# Patient Record
Sex: Male | Born: 1935 | Race: White | Hispanic: No | Smoking: Former smoker
Health system: Southern US, Community
[De-identification: ages and names within clinical notes are randomized; demographics above are authoritative.]

## PROBLEM LIST (undated history)

## (undated) DIAGNOSIS — I10 Essential (primary) hypertension: Secondary | ICD-10-CM

## (undated) DIAGNOSIS — M069 Rheumatoid arthritis, unspecified: Secondary | ICD-10-CM

## (undated) DIAGNOSIS — M1990 Unspecified osteoarthritis, unspecified site: Secondary | ICD-10-CM

## (undated) DIAGNOSIS — K219 Gastro-esophageal reflux disease without esophagitis: Secondary | ICD-10-CM

## (undated) HISTORY — PX: TONSILLECTOMY: SUR1361

## (undated) HISTORY — PX: INNER EAR SURGERY: SHX679

## (undated) HISTORY — PX: RETINAL DETACHMENT SURGERY: SHX105

## (undated) HISTORY — PX: REPLACEMENT TOTAL KNEE: SUR1224

## (undated) HISTORY — PX: EYE SURGERY: SHX253

## (undated) HISTORY — PX: JOINT REPLACEMENT: SHX530

## (undated) HISTORY — PX: ANKLE SURGERY: SHX546

## (undated) HISTORY — PX: WRIST SURGERY: SHX841

---

## 2018-05-11 DIAGNOSIS — M05772 Rheumatoid arthritis with rheumatoid factor of left ankle and foot without organ or systems involvement: Secondary | ICD-10-CM | POA: Diagnosis not present

## 2018-05-11 DIAGNOSIS — M25571 Pain in right ankle and joints of right foot: Secondary | ICD-10-CM | POA: Diagnosis not present

## 2018-05-11 DIAGNOSIS — M05771 Rheumatoid arthritis with rheumatoid factor of right ankle and foot without organ or systems involvement: Secondary | ICD-10-CM | POA: Diagnosis not present

## 2018-05-11 DIAGNOSIS — M2011 Hallux valgus (acquired), right foot: Secondary | ICD-10-CM | POA: Diagnosis not present

## 2018-05-11 DIAGNOSIS — M2022 Hallux rigidus, left foot: Secondary | ICD-10-CM | POA: Diagnosis not present

## 2018-05-11 DIAGNOSIS — M2012 Hallux valgus (acquired), left foot: Secondary | ICD-10-CM | POA: Diagnosis not present

## 2018-05-11 DIAGNOSIS — M2021 Hallux rigidus, right foot: Secondary | ICD-10-CM | POA: Diagnosis not present

## 2018-05-11 DIAGNOSIS — M25572 Pain in left ankle and joints of left foot: Secondary | ICD-10-CM | POA: Diagnosis not present

## 2018-05-11 DIAGNOSIS — M19072 Primary osteoarthritis, left ankle and foot: Secondary | ICD-10-CM | POA: Diagnosis not present

## 2018-05-11 DIAGNOSIS — M19071 Primary osteoarthritis, right ankle and foot: Secondary | ICD-10-CM | POA: Diagnosis not present

## 2018-05-20 DIAGNOSIS — M19071 Primary osteoarthritis, right ankle and foot: Secondary | ICD-10-CM | POA: Diagnosis not present

## 2018-05-20 DIAGNOSIS — M25572 Pain in left ankle and joints of left foot: Secondary | ICD-10-CM | POA: Diagnosis not present

## 2018-05-20 DIAGNOSIS — M05771 Rheumatoid arthritis with rheumatoid factor of right ankle and foot without organ or systems involvement: Secondary | ICD-10-CM | POA: Diagnosis not present

## 2018-05-20 DIAGNOSIS — M79672 Pain in left foot: Secondary | ICD-10-CM | POA: Diagnosis not present

## 2018-05-20 DIAGNOSIS — M25571 Pain in right ankle and joints of right foot: Secondary | ICD-10-CM | POA: Diagnosis not present

## 2018-05-20 DIAGNOSIS — M2011 Hallux valgus (acquired), right foot: Secondary | ICD-10-CM | POA: Diagnosis not present

## 2018-05-20 DIAGNOSIS — M2012 Hallux valgus (acquired), left foot: Secondary | ICD-10-CM | POA: Diagnosis not present

## 2018-05-20 DIAGNOSIS — M19072 Primary osteoarthritis, left ankle and foot: Secondary | ICD-10-CM | POA: Diagnosis not present

## 2018-05-20 DIAGNOSIS — M2022 Hallux rigidus, left foot: Secondary | ICD-10-CM | POA: Diagnosis not present

## 2018-05-20 DIAGNOSIS — M2021 Hallux rigidus, right foot: Secondary | ICD-10-CM | POA: Diagnosis not present

## 2018-05-20 DIAGNOSIS — M05772 Rheumatoid arthritis with rheumatoid factor of left ankle and foot without organ or systems involvement: Secondary | ICD-10-CM | POA: Diagnosis not present

## 2018-06-01 DIAGNOSIS — M2021 Hallux rigidus, right foot: Secondary | ICD-10-CM | POA: Diagnosis not present

## 2018-06-01 DIAGNOSIS — M05772 Rheumatoid arthritis with rheumatoid factor of left ankle and foot without organ or systems involvement: Secondary | ICD-10-CM | POA: Diagnosis not present

## 2018-06-01 DIAGNOSIS — M2022 Hallux rigidus, left foot: Secondary | ICD-10-CM | POA: Diagnosis not present

## 2018-06-01 DIAGNOSIS — M19071 Primary osteoarthritis, right ankle and foot: Secondary | ICD-10-CM | POA: Diagnosis not present

## 2018-06-01 DIAGNOSIS — M19072 Primary osteoarthritis, left ankle and foot: Secondary | ICD-10-CM | POA: Diagnosis not present

## 2018-06-01 DIAGNOSIS — M05771 Rheumatoid arthritis with rheumatoid factor of right ankle and foot without organ or systems involvement: Secondary | ICD-10-CM | POA: Diagnosis not present

## 2018-06-01 DIAGNOSIS — M2011 Hallux valgus (acquired), right foot: Secondary | ICD-10-CM | POA: Diagnosis not present

## 2018-06-01 DIAGNOSIS — M2012 Hallux valgus (acquired), left foot: Secondary | ICD-10-CM | POA: Diagnosis not present

## 2018-06-11 DIAGNOSIS — Z23 Encounter for immunization: Secondary | ICD-10-CM | POA: Diagnosis not present

## 2018-06-11 DIAGNOSIS — M1990 Unspecified osteoarthritis, unspecified site: Secondary | ICD-10-CM | POA: Diagnosis not present

## 2018-06-11 DIAGNOSIS — H7190 Unspecified cholesteatoma, unspecified ear: Secondary | ICD-10-CM | POA: Diagnosis not present

## 2018-06-11 DIAGNOSIS — D692 Other nonthrombocytopenic purpura: Secondary | ICD-10-CM | POA: Diagnosis not present

## 2018-06-11 DIAGNOSIS — Z6827 Body mass index (BMI) 27.0-27.9, adult: Secondary | ICD-10-CM | POA: Diagnosis not present

## 2018-06-11 DIAGNOSIS — R351 Nocturia: Secondary | ICD-10-CM | POA: Diagnosis not present

## 2018-06-11 DIAGNOSIS — I10 Essential (primary) hypertension: Secondary | ICD-10-CM | POA: Diagnosis not present

## 2018-06-11 DIAGNOSIS — Z8669 Personal history of other diseases of the nervous system and sense organs: Secondary | ICD-10-CM | POA: Diagnosis not present

## 2018-06-11 DIAGNOSIS — M069 Rheumatoid arthritis, unspecified: Secondary | ICD-10-CM | POA: Diagnosis not present

## 2018-06-11 DIAGNOSIS — H9193 Unspecified hearing loss, bilateral: Secondary | ICD-10-CM | POA: Diagnosis not present

## 2018-06-11 DIAGNOSIS — D8989 Other specified disorders involving the immune mechanism, not elsewhere classified: Secondary | ICD-10-CM | POA: Diagnosis not present

## 2018-06-11 DIAGNOSIS — R7309 Other abnormal glucose: Secondary | ICD-10-CM | POA: Diagnosis not present

## 2018-06-11 DIAGNOSIS — R82998 Other abnormal findings in urine: Secondary | ICD-10-CM | POA: Diagnosis not present

## 2018-07-08 DIAGNOSIS — M25511 Pain in right shoulder: Secondary | ICD-10-CM | POA: Diagnosis not present

## 2018-07-08 DIAGNOSIS — Z6826 Body mass index (BMI) 26.0-26.9, adult: Secondary | ICD-10-CM | POA: Diagnosis not present

## 2018-07-08 DIAGNOSIS — M2550 Pain in unspecified joint: Secondary | ICD-10-CM | POA: Diagnosis not present

## 2018-07-08 DIAGNOSIS — G8929 Other chronic pain: Secondary | ICD-10-CM | POA: Diagnosis not present

## 2018-07-08 DIAGNOSIS — M0579 Rheumatoid arthritis with rheumatoid factor of multiple sites without organ or systems involvement: Secondary | ICD-10-CM | POA: Diagnosis not present

## 2018-07-08 DIAGNOSIS — E663 Overweight: Secondary | ICD-10-CM | POA: Diagnosis not present

## 2018-07-10 DIAGNOSIS — I10 Essential (primary) hypertension: Secondary | ICD-10-CM | POA: Diagnosis not present

## 2018-07-10 DIAGNOSIS — M19072 Primary osteoarthritis, left ankle and foot: Secondary | ICD-10-CM | POA: Diagnosis not present

## 2018-07-10 DIAGNOSIS — Z01818 Encounter for other preprocedural examination: Secondary | ICD-10-CM | POA: Diagnosis not present

## 2018-07-10 DIAGNOSIS — M5030 Other cervical disc degeneration, unspecified cervical region: Secondary | ICD-10-CM | POA: Diagnosis not present

## 2018-07-10 DIAGNOSIS — M47816 Spondylosis without myelopathy or radiculopathy, lumbar region: Secondary | ICD-10-CM | POA: Diagnosis not present

## 2018-07-10 DIAGNOSIS — M0520 Rheumatoid vasculitis with rheumatoid arthritis of unspecified site: Secondary | ICD-10-CM | POA: Diagnosis not present

## 2018-07-14 DIAGNOSIS — Z961 Presence of intraocular lens: Secondary | ICD-10-CM | POA: Diagnosis not present

## 2018-07-14 DIAGNOSIS — H33051 Total retinal detachment, right eye: Secondary | ICD-10-CM | POA: Diagnosis not present

## 2018-07-14 DIAGNOSIS — H353132 Nonexudative age-related macular degeneration, bilateral, intermediate dry stage: Secondary | ICD-10-CM | POA: Diagnosis not present

## 2018-07-14 DIAGNOSIS — H26492 Other secondary cataract, left eye: Secondary | ICD-10-CM | POA: Diagnosis not present

## 2018-08-04 DIAGNOSIS — M19072 Primary osteoarthritis, left ankle and foot: Secondary | ICD-10-CM | POA: Diagnosis not present

## 2018-08-04 DIAGNOSIS — M7989 Other specified soft tissue disorders: Secondary | ICD-10-CM | POA: Diagnosis not present

## 2018-08-04 DIAGNOSIS — I10 Essential (primary) hypertension: Secondary | ICD-10-CM | POA: Diagnosis not present

## 2018-08-04 DIAGNOSIS — M069 Rheumatoid arthritis, unspecified: Secondary | ICD-10-CM | POA: Diagnosis not present

## 2018-08-05 DIAGNOSIS — M069 Rheumatoid arthritis, unspecified: Secondary | ICD-10-CM | POA: Diagnosis not present

## 2018-08-05 DIAGNOSIS — I10 Essential (primary) hypertension: Secondary | ICD-10-CM | POA: Diagnosis not present

## 2018-08-06 DIAGNOSIS — M2012 Hallux valgus (acquired), left foot: Secondary | ICD-10-CM | POA: Diagnosis present

## 2018-08-06 DIAGNOSIS — M19072 Primary osteoarthritis, left ankle and foot: Secondary | ICD-10-CM | POA: Diagnosis not present

## 2018-08-06 DIAGNOSIS — R278 Other lack of coordination: Secondary | ICD-10-CM | POA: Diagnosis not present

## 2018-08-06 DIAGNOSIS — Z87891 Personal history of nicotine dependence: Secondary | ICD-10-CM | POA: Diagnosis not present

## 2018-08-06 DIAGNOSIS — M2021 Hallux rigidus, right foot: Secondary | ICD-10-CM | POA: Diagnosis present

## 2018-08-06 DIAGNOSIS — M2022 Hallux rigidus, left foot: Secondary | ICD-10-CM | POA: Diagnosis present

## 2018-08-06 DIAGNOSIS — M069 Rheumatoid arthritis, unspecified: Secondary | ICD-10-CM | POA: Diagnosis not present

## 2018-08-06 DIAGNOSIS — Z4789 Encounter for other orthopedic aftercare: Secondary | ICD-10-CM | POA: Diagnosis not present

## 2018-08-06 DIAGNOSIS — Z79899 Other long term (current) drug therapy: Secondary | ICD-10-CM | POA: Diagnosis not present

## 2018-08-06 DIAGNOSIS — I10 Essential (primary) hypertension: Secondary | ICD-10-CM | POA: Diagnosis not present

## 2018-08-06 DIAGNOSIS — Z7982 Long term (current) use of aspirin: Secondary | ICD-10-CM | POA: Diagnosis not present

## 2018-08-06 DIAGNOSIS — R262 Difficulty in walking, not elsewhere classified: Secondary | ICD-10-CM | POA: Diagnosis not present

## 2018-08-06 DIAGNOSIS — M2011 Hallux valgus (acquired), right foot: Secondary | ICD-10-CM | POA: Diagnosis present

## 2018-08-06 DIAGNOSIS — Z741 Need for assistance with personal care: Secondary | ICD-10-CM | POA: Diagnosis not present

## 2018-08-10 DIAGNOSIS — M069 Rheumatoid arthritis, unspecified: Secondary | ICD-10-CM | POA: Diagnosis not present

## 2018-08-10 DIAGNOSIS — M19072 Primary osteoarthritis, left ankle and foot: Secondary | ICD-10-CM | POA: Diagnosis not present

## 2018-08-10 DIAGNOSIS — Z4789 Encounter for other orthopedic aftercare: Secondary | ICD-10-CM | POA: Diagnosis not present

## 2018-08-10 DIAGNOSIS — S99912A Unspecified injury of left ankle, initial encounter: Secondary | ICD-10-CM | POA: Diagnosis not present

## 2018-08-10 DIAGNOSIS — M159 Polyosteoarthritis, unspecified: Secondary | ICD-10-CM | POA: Diagnosis not present

## 2018-08-10 DIAGNOSIS — M0639 Rheumatoid nodule, multiple sites: Secondary | ICD-10-CM | POA: Diagnosis not present

## 2018-08-10 DIAGNOSIS — I10 Essential (primary) hypertension: Secondary | ICD-10-CM | POA: Diagnosis not present

## 2018-08-10 DIAGNOSIS — Z96662 Presence of left artificial ankle joint: Secondary | ICD-10-CM | POA: Diagnosis not present

## 2018-08-10 DIAGNOSIS — Z741 Need for assistance with personal care: Secondary | ICD-10-CM | POA: Diagnosis not present

## 2018-08-10 DIAGNOSIS — R262 Difficulty in walking, not elsewhere classified: Secondary | ICD-10-CM | POA: Diagnosis not present

## 2018-08-10 DIAGNOSIS — R278 Other lack of coordination: Secondary | ICD-10-CM | POA: Diagnosis not present

## 2018-08-10 DIAGNOSIS — M19071 Primary osteoarthritis, right ankle and foot: Secondary | ICD-10-CM | POA: Diagnosis not present

## 2018-08-11 DIAGNOSIS — S99912A Unspecified injury of left ankle, initial encounter: Secondary | ICD-10-CM | POA: Diagnosis not present

## 2018-08-11 DIAGNOSIS — M0639 Rheumatoid nodule, multiple sites: Secondary | ICD-10-CM | POA: Diagnosis not present

## 2018-08-11 DIAGNOSIS — M159 Polyosteoarthritis, unspecified: Secondary | ICD-10-CM | POA: Diagnosis not present

## 2018-08-11 DIAGNOSIS — I10 Essential (primary) hypertension: Secondary | ICD-10-CM | POA: Diagnosis not present

## 2018-08-17 DIAGNOSIS — M19071 Primary osteoarthritis, right ankle and foot: Secondary | ICD-10-CM | POA: Diagnosis not present

## 2018-08-17 DIAGNOSIS — M19072 Primary osteoarthritis, left ankle and foot: Secondary | ICD-10-CM | POA: Diagnosis not present

## 2018-08-24 DIAGNOSIS — M159 Polyosteoarthritis, unspecified: Secondary | ICD-10-CM | POA: Diagnosis not present

## 2018-08-24 DIAGNOSIS — I10 Essential (primary) hypertension: Secondary | ICD-10-CM | POA: Diagnosis not present

## 2018-08-24 DIAGNOSIS — Z96662 Presence of left artificial ankle joint: Secondary | ICD-10-CM | POA: Diagnosis not present

## 2018-08-24 DIAGNOSIS — M069 Rheumatoid arthritis, unspecified: Secondary | ICD-10-CM | POA: Diagnosis not present

## 2018-08-28 DIAGNOSIS — Z23 Encounter for immunization: Secondary | ICD-10-CM | POA: Diagnosis not present

## 2018-08-28 DIAGNOSIS — M1990 Unspecified osteoarthritis, unspecified site: Secondary | ICD-10-CM | POA: Diagnosis not present

## 2018-08-28 DIAGNOSIS — I10 Essential (primary) hypertension: Secondary | ICD-10-CM | POA: Diagnosis not present

## 2018-08-28 DIAGNOSIS — D8989 Other specified disorders involving the immune mechanism, not elsewhere classified: Secondary | ICD-10-CM | POA: Diagnosis not present

## 2018-08-28 DIAGNOSIS — M069 Rheumatoid arthritis, unspecified: Secondary | ICD-10-CM | POA: Diagnosis not present

## 2018-08-29 DIAGNOSIS — M19072 Primary osteoarthritis, left ankle and foot: Secondary | ICD-10-CM | POA: Diagnosis not present

## 2018-08-29 DIAGNOSIS — Z981 Arthrodesis status: Secondary | ICD-10-CM | POA: Diagnosis not present

## 2018-08-29 DIAGNOSIS — M069 Rheumatoid arthritis, unspecified: Secondary | ICD-10-CM | POA: Diagnosis not present

## 2018-08-29 DIAGNOSIS — Z4789 Encounter for other orthopedic aftercare: Secondary | ICD-10-CM | POA: Diagnosis not present

## 2018-08-29 DIAGNOSIS — I10 Essential (primary) hypertension: Secondary | ICD-10-CM | POA: Diagnosis not present

## 2018-08-31 DIAGNOSIS — Z981 Arthrodesis status: Secondary | ICD-10-CM | POA: Diagnosis not present

## 2018-08-31 DIAGNOSIS — M19072 Primary osteoarthritis, left ankle and foot: Secondary | ICD-10-CM | POA: Diagnosis not present

## 2018-08-31 DIAGNOSIS — Z4789 Encounter for other orthopedic aftercare: Secondary | ICD-10-CM | POA: Diagnosis not present

## 2018-08-31 DIAGNOSIS — M069 Rheumatoid arthritis, unspecified: Secondary | ICD-10-CM | POA: Diagnosis not present

## 2018-08-31 DIAGNOSIS — I10 Essential (primary) hypertension: Secondary | ICD-10-CM | POA: Diagnosis not present

## 2018-09-02 DIAGNOSIS — M19072 Primary osteoarthritis, left ankle and foot: Secondary | ICD-10-CM | POA: Diagnosis not present

## 2018-09-02 DIAGNOSIS — M069 Rheumatoid arthritis, unspecified: Secondary | ICD-10-CM | POA: Diagnosis not present

## 2018-09-02 DIAGNOSIS — Z981 Arthrodesis status: Secondary | ICD-10-CM | POA: Diagnosis not present

## 2018-09-02 DIAGNOSIS — I10 Essential (primary) hypertension: Secondary | ICD-10-CM | POA: Diagnosis not present

## 2018-09-02 DIAGNOSIS — Z4789 Encounter for other orthopedic aftercare: Secondary | ICD-10-CM | POA: Diagnosis not present

## 2018-09-04 DIAGNOSIS — Z4789 Encounter for other orthopedic aftercare: Secondary | ICD-10-CM | POA: Diagnosis not present

## 2018-09-04 DIAGNOSIS — I10 Essential (primary) hypertension: Secondary | ICD-10-CM | POA: Diagnosis not present

## 2018-09-04 DIAGNOSIS — Z981 Arthrodesis status: Secondary | ICD-10-CM | POA: Diagnosis not present

## 2018-09-04 DIAGNOSIS — M069 Rheumatoid arthritis, unspecified: Secondary | ICD-10-CM | POA: Diagnosis not present

## 2018-09-04 DIAGNOSIS — M19072 Primary osteoarthritis, left ankle and foot: Secondary | ICD-10-CM | POA: Diagnosis not present

## 2018-09-07 DIAGNOSIS — M069 Rheumatoid arthritis, unspecified: Secondary | ICD-10-CM | POA: Diagnosis not present

## 2018-09-07 DIAGNOSIS — Z981 Arthrodesis status: Secondary | ICD-10-CM | POA: Diagnosis not present

## 2018-09-07 DIAGNOSIS — Z4789 Encounter for other orthopedic aftercare: Secondary | ICD-10-CM | POA: Diagnosis not present

## 2018-09-07 DIAGNOSIS — I10 Essential (primary) hypertension: Secondary | ICD-10-CM | POA: Diagnosis not present

## 2018-09-07 DIAGNOSIS — M19072 Primary osteoarthritis, left ankle and foot: Secondary | ICD-10-CM | POA: Diagnosis not present

## 2018-09-09 DIAGNOSIS — M069 Rheumatoid arthritis, unspecified: Secondary | ICD-10-CM | POA: Diagnosis not present

## 2018-09-09 DIAGNOSIS — Z981 Arthrodesis status: Secondary | ICD-10-CM | POA: Diagnosis not present

## 2018-09-09 DIAGNOSIS — M19072 Primary osteoarthritis, left ankle and foot: Secondary | ICD-10-CM | POA: Diagnosis not present

## 2018-09-09 DIAGNOSIS — Z4789 Encounter for other orthopedic aftercare: Secondary | ICD-10-CM | POA: Diagnosis not present

## 2018-09-09 DIAGNOSIS — I10 Essential (primary) hypertension: Secondary | ICD-10-CM | POA: Diagnosis not present

## 2018-09-14 DIAGNOSIS — M19072 Primary osteoarthritis, left ankle and foot: Secondary | ICD-10-CM | POA: Diagnosis not present

## 2018-09-15 DIAGNOSIS — M19072 Primary osteoarthritis, left ankle and foot: Secondary | ICD-10-CM | POA: Diagnosis not present

## 2018-09-15 DIAGNOSIS — Z981 Arthrodesis status: Secondary | ICD-10-CM | POA: Diagnosis not present

## 2018-09-15 DIAGNOSIS — M069 Rheumatoid arthritis, unspecified: Secondary | ICD-10-CM | POA: Diagnosis not present

## 2018-09-15 DIAGNOSIS — I10 Essential (primary) hypertension: Secondary | ICD-10-CM | POA: Diagnosis not present

## 2018-09-15 DIAGNOSIS — Z4789 Encounter for other orthopedic aftercare: Secondary | ICD-10-CM | POA: Diagnosis not present

## 2018-09-17 DIAGNOSIS — M069 Rheumatoid arthritis, unspecified: Secondary | ICD-10-CM | POA: Diagnosis not present

## 2018-09-17 DIAGNOSIS — Z981 Arthrodesis status: Secondary | ICD-10-CM | POA: Diagnosis not present

## 2018-09-17 DIAGNOSIS — Z4789 Encounter for other orthopedic aftercare: Secondary | ICD-10-CM | POA: Diagnosis not present

## 2018-09-17 DIAGNOSIS — M19072 Primary osteoarthritis, left ankle and foot: Secondary | ICD-10-CM | POA: Diagnosis not present

## 2018-09-17 DIAGNOSIS — I10 Essential (primary) hypertension: Secondary | ICD-10-CM | POA: Diagnosis not present

## 2018-09-21 DIAGNOSIS — H906 Mixed conductive and sensorineural hearing loss, bilateral: Secondary | ICD-10-CM | POA: Diagnosis not present

## 2018-09-21 DIAGNOSIS — H95193 Other disorders following mastoidectomy, bilateral ears: Secondary | ICD-10-CM | POA: Diagnosis not present

## 2018-09-23 DIAGNOSIS — Z981 Arthrodesis status: Secondary | ICD-10-CM | POA: Diagnosis not present

## 2018-09-23 DIAGNOSIS — Z4789 Encounter for other orthopedic aftercare: Secondary | ICD-10-CM | POA: Diagnosis not present

## 2018-09-23 DIAGNOSIS — M19072 Primary osteoarthritis, left ankle and foot: Secondary | ICD-10-CM | POA: Diagnosis not present

## 2018-09-23 DIAGNOSIS — I10 Essential (primary) hypertension: Secondary | ICD-10-CM | POA: Diagnosis not present

## 2018-09-23 DIAGNOSIS — M069 Rheumatoid arthritis, unspecified: Secondary | ICD-10-CM | POA: Diagnosis not present

## 2018-09-25 DIAGNOSIS — I10 Essential (primary) hypertension: Secondary | ICD-10-CM | POA: Diagnosis not present

## 2018-09-25 DIAGNOSIS — Z4789 Encounter for other orthopedic aftercare: Secondary | ICD-10-CM | POA: Diagnosis not present

## 2018-09-25 DIAGNOSIS — M069 Rheumatoid arthritis, unspecified: Secondary | ICD-10-CM | POA: Diagnosis not present

## 2018-09-25 DIAGNOSIS — M19072 Primary osteoarthritis, left ankle and foot: Secondary | ICD-10-CM | POA: Diagnosis not present

## 2018-09-25 DIAGNOSIS — Z981 Arthrodesis status: Secondary | ICD-10-CM | POA: Diagnosis not present

## 2018-10-12 DIAGNOSIS — M19072 Primary osteoarthritis, left ankle and foot: Secondary | ICD-10-CM | POA: Diagnosis not present

## 2018-10-22 DIAGNOSIS — M2550 Pain in unspecified joint: Secondary | ICD-10-CM | POA: Diagnosis not present

## 2018-10-22 DIAGNOSIS — M25511 Pain in right shoulder: Secondary | ICD-10-CM | POA: Diagnosis not present

## 2018-10-22 DIAGNOSIS — E663 Overweight: Secondary | ICD-10-CM | POA: Diagnosis not present

## 2018-10-22 DIAGNOSIS — Z6827 Body mass index (BMI) 27.0-27.9, adult: Secondary | ICD-10-CM | POA: Diagnosis not present

## 2018-10-22 DIAGNOSIS — G8929 Other chronic pain: Secondary | ICD-10-CM | POA: Diagnosis not present

## 2018-10-22 DIAGNOSIS — M0579 Rheumatoid arthritis with rheumatoid factor of multiple sites without organ or systems involvement: Secondary | ICD-10-CM | POA: Diagnosis not present

## 2019-02-11 DIAGNOSIS — M0579 Rheumatoid arthritis with rheumatoid factor of multiple sites without organ or systems involvement: Secondary | ICD-10-CM | POA: Diagnosis not present

## 2019-02-16 ENCOUNTER — Ambulatory Visit: Payer: Self-pay | Admitting: Internal Medicine

## 2019-03-29 DIAGNOSIS — G8929 Other chronic pain: Secondary | ICD-10-CM | POA: Diagnosis not present

## 2019-03-29 DIAGNOSIS — M25511 Pain in right shoulder: Secondary | ICD-10-CM | POA: Diagnosis not present

## 2019-03-29 DIAGNOSIS — M2550 Pain in unspecified joint: Secondary | ICD-10-CM | POA: Diagnosis not present

## 2019-03-29 DIAGNOSIS — M25531 Pain in right wrist: Secondary | ICD-10-CM | POA: Diagnosis not present

## 2019-03-29 DIAGNOSIS — M0579 Rheumatoid arthritis with rheumatoid factor of multiple sites without organ or systems involvement: Secondary | ICD-10-CM | POA: Diagnosis not present

## 2019-03-31 DIAGNOSIS — M2021 Hallux rigidus, right foot: Secondary | ICD-10-CM | POA: Diagnosis not present

## 2019-03-31 DIAGNOSIS — M05771 Rheumatoid arthritis with rheumatoid factor of right ankle and foot without organ or systems involvement: Secondary | ICD-10-CM | POA: Diagnosis not present

## 2019-03-31 DIAGNOSIS — M05772 Rheumatoid arthritis with rheumatoid factor of left ankle and foot without organ or systems involvement: Secondary | ICD-10-CM | POA: Diagnosis not present

## 2019-03-31 DIAGNOSIS — M2022 Hallux rigidus, left foot: Secondary | ICD-10-CM | POA: Diagnosis not present

## 2019-03-31 DIAGNOSIS — M19072 Primary osteoarthritis, left ankle and foot: Secondary | ICD-10-CM | POA: Diagnosis not present

## 2019-03-31 DIAGNOSIS — M87073 Idiopathic aseptic necrosis of unspecified ankle: Secondary | ICD-10-CM | POA: Diagnosis not present

## 2019-03-31 DIAGNOSIS — M19071 Primary osteoarthritis, right ankle and foot: Secondary | ICD-10-CM | POA: Diagnosis not present

## 2019-04-22 DIAGNOSIS — M05772 Rheumatoid arthritis with rheumatoid factor of left ankle and foot without organ or systems involvement: Secondary | ICD-10-CM | POA: Diagnosis not present

## 2019-04-22 DIAGNOSIS — M05771 Rheumatoid arthritis with rheumatoid factor of right ankle and foot without organ or systems involvement: Secondary | ICD-10-CM | POA: Diagnosis not present

## 2019-04-22 DIAGNOSIS — M1990 Unspecified osteoarthritis, unspecified site: Secondary | ICD-10-CM | POA: Diagnosis not present

## 2019-04-22 DIAGNOSIS — M19071 Primary osteoarthritis, right ankle and foot: Secondary | ICD-10-CM | POA: Diagnosis not present

## 2019-04-22 DIAGNOSIS — M19072 Primary osteoarthritis, left ankle and foot: Secondary | ICD-10-CM | POA: Diagnosis not present

## 2019-04-22 DIAGNOSIS — M25571 Pain in right ankle and joints of right foot: Secondary | ICD-10-CM | POA: Diagnosis not present

## 2019-04-22 DIAGNOSIS — Z01818 Encounter for other preprocedural examination: Secondary | ICD-10-CM | POA: Diagnosis not present

## 2019-04-22 DIAGNOSIS — M87073 Idiopathic aseptic necrosis of unspecified ankle: Secondary | ICD-10-CM | POA: Diagnosis not present

## 2019-05-12 DIAGNOSIS — Z125 Encounter for screening for malignant neoplasm of prostate: Secondary | ICD-10-CM | POA: Diagnosis not present

## 2019-05-12 DIAGNOSIS — R739 Hyperglycemia, unspecified: Secondary | ICD-10-CM | POA: Diagnosis not present

## 2019-05-12 DIAGNOSIS — I10 Essential (primary) hypertension: Secondary | ICD-10-CM | POA: Diagnosis not present

## 2019-05-14 DIAGNOSIS — M06832 Other specified rheumatoid arthritis, left wrist: Secondary | ICD-10-CM | POA: Diagnosis not present

## 2019-05-14 DIAGNOSIS — M79642 Pain in left hand: Secondary | ICD-10-CM | POA: Diagnosis not present

## 2019-05-14 DIAGNOSIS — R82998 Other abnormal findings in urine: Secondary | ICD-10-CM | POA: Diagnosis not present

## 2019-05-14 DIAGNOSIS — I10 Essential (primary) hypertension: Secondary | ICD-10-CM | POA: Diagnosis not present

## 2019-05-14 DIAGNOSIS — M06831 Other specified rheumatoid arthritis, right wrist: Secondary | ICD-10-CM | POA: Diagnosis not present

## 2019-05-14 DIAGNOSIS — M79641 Pain in right hand: Secondary | ICD-10-CM | POA: Diagnosis not present

## 2019-05-19 DIAGNOSIS — R351 Nocturia: Secondary | ICD-10-CM | POA: Diagnosis not present

## 2019-05-19 DIAGNOSIS — D692 Other nonthrombocytopenic purpura: Secondary | ICD-10-CM | POA: Diagnosis not present

## 2019-05-19 DIAGNOSIS — Z8669 Personal history of other diseases of the nervous system and sense organs: Secondary | ICD-10-CM | POA: Diagnosis not present

## 2019-05-19 DIAGNOSIS — I10 Essential (primary) hypertension: Secondary | ICD-10-CM | POA: Diagnosis not present

## 2019-05-19 DIAGNOSIS — R739 Hyperglycemia, unspecified: Secondary | ICD-10-CM | POA: Diagnosis not present

## 2019-05-19 DIAGNOSIS — M069 Rheumatoid arthritis, unspecified: Secondary | ICD-10-CM | POA: Diagnosis not present

## 2019-05-19 DIAGNOSIS — Z1331 Encounter for screening for depression: Secondary | ICD-10-CM | POA: Diagnosis not present

## 2019-05-19 DIAGNOSIS — Z1339 Encounter for screening examination for other mental health and behavioral disorders: Secondary | ICD-10-CM | POA: Diagnosis not present

## 2019-05-19 DIAGNOSIS — H9193 Unspecified hearing loss, bilateral: Secondary | ICD-10-CM | POA: Diagnosis not present

## 2019-05-19 DIAGNOSIS — H7190 Unspecified cholesteatoma, unspecified ear: Secondary | ICD-10-CM | POA: Diagnosis not present

## 2019-05-19 DIAGNOSIS — M1990 Unspecified osteoarthritis, unspecified site: Secondary | ICD-10-CM | POA: Diagnosis not present

## 2019-05-19 DIAGNOSIS — Z0000 Encounter for general adult medical examination without abnormal findings: Secondary | ICD-10-CM | POA: Diagnosis not present

## 2019-05-19 DIAGNOSIS — D8989 Other specified disorders involving the immune mechanism, not elsewhere classified: Secondary | ICD-10-CM | POA: Diagnosis not present

## 2019-05-26 DIAGNOSIS — M05771 Rheumatoid arthritis with rheumatoid factor of right ankle and foot without organ or systems involvement: Secondary | ICD-10-CM | POA: Diagnosis not present

## 2019-05-26 DIAGNOSIS — M2012 Hallux valgus (acquired), left foot: Secondary | ICD-10-CM | POA: Diagnosis not present

## 2019-05-26 DIAGNOSIS — M2022 Hallux rigidus, left foot: Secondary | ICD-10-CM | POA: Diagnosis not present

## 2019-05-26 DIAGNOSIS — M05772 Rheumatoid arthritis with rheumatoid factor of left ankle and foot without organ or systems involvement: Secondary | ICD-10-CM | POA: Diagnosis not present

## 2019-05-26 DIAGNOSIS — M2011 Hallux valgus (acquired), right foot: Secondary | ICD-10-CM | POA: Diagnosis not present

## 2019-05-26 DIAGNOSIS — M2021 Hallux rigidus, right foot: Secondary | ICD-10-CM | POA: Diagnosis not present

## 2019-05-26 DIAGNOSIS — M19072 Primary osteoarthritis, left ankle and foot: Secondary | ICD-10-CM | POA: Diagnosis not present

## 2019-05-26 DIAGNOSIS — M87073 Idiopathic aseptic necrosis of unspecified ankle: Secondary | ICD-10-CM | POA: Diagnosis not present

## 2019-05-26 DIAGNOSIS — M19071 Primary osteoarthritis, right ankle and foot: Secondary | ICD-10-CM | POA: Diagnosis not present

## 2019-11-05 ENCOUNTER — Ambulatory Visit: Payer: Medicare Other | Attending: Internal Medicine

## 2019-11-05 DIAGNOSIS — Z23 Encounter for immunization: Secondary | ICD-10-CM

## 2019-11-05 NOTE — Progress Notes (Signed)
   Covid-19 Vaccination Clinic  Name:  Kenneth Roy    MRN: [id number] DOB: 13-Oct-1927  11/05/2019  [name] was observed post Covid-19 immunization for 15 minutes without incidence. He was provided with Vaccine Information Sheet and instruction to access the V-Safe system.   [name] was instructed to call 911 with any severe reactions post vaccine: Marland Kitchen Difficulty breathing  . Swelling of your face and throat  . A fast heartbeat  . A bad rash all over your body  . Dizziness and weakness    Immunizations Administered    Name Date Dose VIS Date Route   Pfizer COVID-19 Vaccine 11/05/2019  2:42 PM 0.3 mL 09/24/2019 Intramuscular   Manufacturer: [employer], [employer]   Lot: [id number]   NDC: [address]-1

## 2019-11-26 ENCOUNTER — Ambulatory Visit: Payer: Medicare Other | Attending: Internal Medicine

## 2019-11-26 DIAGNOSIS — Z23 Encounter for immunization: Secondary | ICD-10-CM | POA: Insufficient documentation

## 2019-11-26 NOTE — Progress Notes (Signed)
   Covid-19 Vaccination Clinic  Name:  Ephram Kornegay    MRN: [id number] DOB: [birth year]  11/26/2019  Mr. Umstead was observed post Covid-19 immunization for 15 minutes without incidence. He was provided with Vaccine Information Sheet and instruction to access the V-Safe system.   Mr. Vidrio was instructed to call 911 with any severe reactions post vaccine: Marland Kitchen Difficulty breathing  . Swelling of your face and throat  . A fast heartbeat  . A bad rash all over your body  . Dizziness and weakness    Immunizations Administered    Name Date Dose VIS Date Route   Pfizer COVID-19 Vaccine 11/26/2019  9:32 AM 0.3 mL 09/24/2019 Intramuscular   Manufacturer: [employer], [employer]   Lot: [id number]   NDC: [address]-5

## 2019-12-30 DIAGNOSIS — L570 Actinic keratosis: Secondary | ICD-10-CM | POA: Diagnosis not present

## 2020-02-11 DIAGNOSIS — E663 Overweight: Secondary | ICD-10-CM | POA: Diagnosis not present

## 2020-02-11 DIAGNOSIS — Z6825 Body mass index (BMI) 25.0-25.9, adult: Secondary | ICD-10-CM | POA: Diagnosis not present

## 2020-02-11 DIAGNOSIS — M2550 Pain in unspecified joint: Secondary | ICD-10-CM | POA: Diagnosis not present

## 2020-02-11 DIAGNOSIS — G8929 Other chronic pain: Secondary | ICD-10-CM | POA: Diagnosis not present

## 2020-02-11 DIAGNOSIS — M25531 Pain in right wrist: Secondary | ICD-10-CM | POA: Diagnosis not present

## 2020-02-11 DIAGNOSIS — M25511 Pain in right shoulder: Secondary | ICD-10-CM | POA: Diagnosis not present

## 2020-02-11 DIAGNOSIS — M0579 Rheumatoid arthritis with rheumatoid factor of multiple sites without organ or systems involvement: Secondary | ICD-10-CM | POA: Diagnosis not present

## 2020-05-15 DIAGNOSIS — I10 Essential (primary) hypertension: Secondary | ICD-10-CM | POA: Diagnosis not present

## 2020-05-22 DIAGNOSIS — R351 Nocturia: Secondary | ICD-10-CM | POA: Diagnosis not present

## 2020-05-22 DIAGNOSIS — M1990 Unspecified osteoarthritis, unspecified site: Secondary | ICD-10-CM | POA: Diagnosis not present

## 2020-05-22 DIAGNOSIS — R82998 Other abnormal findings in urine: Secondary | ICD-10-CM | POA: Diagnosis not present

## 2020-05-22 DIAGNOSIS — D692 Other nonthrombocytopenic purpura: Secondary | ICD-10-CM | POA: Diagnosis not present

## 2020-05-22 DIAGNOSIS — I10 Essential (primary) hypertension: Secondary | ICD-10-CM | POA: Diagnosis not present

## 2020-05-22 DIAGNOSIS — H7190 Unspecified cholesteatoma, unspecified ear: Secondary | ICD-10-CM | POA: Diagnosis not present

## 2020-05-22 DIAGNOSIS — H9193 Unspecified hearing loss, bilateral: Secondary | ICD-10-CM | POA: Diagnosis not present

## 2020-05-22 DIAGNOSIS — M069 Rheumatoid arthritis, unspecified: Secondary | ICD-10-CM | POA: Diagnosis not present

## 2020-05-22 DIAGNOSIS — Z0000 Encounter for general adult medical examination without abnormal findings: Secondary | ICD-10-CM | POA: Diagnosis not present

## 2020-05-22 DIAGNOSIS — Z8669 Personal history of other diseases of the nervous system and sense organs: Secondary | ICD-10-CM | POA: Diagnosis not present

## 2020-05-22 DIAGNOSIS — D8989 Other specified disorders involving the immune mechanism, not elsewhere classified: Secondary | ICD-10-CM | POA: Diagnosis not present

## 2020-06-07 DIAGNOSIS — L570 Actinic keratosis: Secondary | ICD-10-CM | POA: Diagnosis not present

## 2020-06-07 DIAGNOSIS — L301 Dyshidrosis [pompholyx]: Secondary | ICD-10-CM | POA: Diagnosis not present

## 2020-07-29 DIAGNOSIS — Z23 Encounter for immunization: Secondary | ICD-10-CM | POA: Diagnosis not present

## 2020-08-29 DIAGNOSIS — M25511 Pain in right shoulder: Secondary | ICD-10-CM | POA: Diagnosis not present

## 2020-08-29 DIAGNOSIS — E663 Overweight: Secondary | ICD-10-CM | POA: Diagnosis not present

## 2020-08-29 DIAGNOSIS — M2550 Pain in unspecified joint: Secondary | ICD-10-CM | POA: Diagnosis not present

## 2020-08-29 DIAGNOSIS — M25531 Pain in right wrist: Secondary | ICD-10-CM | POA: Diagnosis not present

## 2020-08-29 DIAGNOSIS — R229 Localized swelling, mass and lump, unspecified: Secondary | ICD-10-CM | POA: Diagnosis not present

## 2020-08-29 DIAGNOSIS — G8929 Other chronic pain: Secondary | ICD-10-CM | POA: Diagnosis not present

## 2020-08-29 DIAGNOSIS — Z6826 Body mass index (BMI) 26.0-26.9, adult: Secondary | ICD-10-CM | POA: Diagnosis not present

## 2020-08-29 DIAGNOSIS — M0579 Rheumatoid arthritis with rheumatoid factor of multiple sites without organ or systems involvement: Secondary | ICD-10-CM | POA: Diagnosis not present

## 2020-10-23 DIAGNOSIS — M06832 Other specified rheumatoid arthritis, left wrist: Secondary | ICD-10-CM | POA: Diagnosis not present

## 2020-10-23 DIAGNOSIS — M06831 Other specified rheumatoid arthritis, right wrist: Secondary | ICD-10-CM | POA: Diagnosis not present

## 2020-10-23 DIAGNOSIS — M65839 Other synovitis and tenosynovitis, unspecified forearm: Secondary | ICD-10-CM | POA: Diagnosis not present

## 2020-10-23 DIAGNOSIS — M06341 Rheumatoid nodule, right hand: Secondary | ICD-10-CM | POA: Diagnosis not present

## 2020-10-23 DIAGNOSIS — M79641 Pain in right hand: Secondary | ICD-10-CM | POA: Diagnosis not present

## 2020-10-23 DIAGNOSIS — M0630 Rheumatoid nodule, unspecified site: Secondary | ICD-10-CM | POA: Diagnosis not present

## 2021-01-10 DIAGNOSIS — M25552 Pain in left hip: Secondary | ICD-10-CM | POA: Diagnosis not present

## 2021-01-17 DIAGNOSIS — M25552 Pain in left hip: Secondary | ICD-10-CM | POA: Diagnosis not present

## 2021-02-05 DIAGNOSIS — M79642 Pain in left hand: Secondary | ICD-10-CM | POA: Diagnosis not present

## 2021-02-05 DIAGNOSIS — M06831 Other specified rheumatoid arthritis, right wrist: Secondary | ICD-10-CM | POA: Diagnosis not present

## 2021-02-05 DIAGNOSIS — M79641 Pain in right hand: Secondary | ICD-10-CM | POA: Diagnosis not present

## 2021-02-05 DIAGNOSIS — M06832 Other specified rheumatoid arthritis, left wrist: Secondary | ICD-10-CM | POA: Diagnosis not present

## 2021-02-16 DIAGNOSIS — M25552 Pain in left hip: Secondary | ICD-10-CM | POA: Diagnosis not present

## 2021-02-21 ENCOUNTER — Encounter (HOSPITAL_COMMUNITY): Payer: Self-pay | Admitting: Orthopedic Surgery

## 2021-02-21 ENCOUNTER — Other Ambulatory Visit: Payer: Self-pay

## 2021-02-21 NOTE — Progress Notes (Addendum)
COVID Vaccine Completed: x2 Date COVID Vaccine completed: - 11-05-19, 11-26-19 Has received booster:   COVID vaccine manufacturer: Pfizer     Date of COVID positive in last 90 days: N/A  PCP - [facility] at [facility] - N/A  Chest x-ray - N/A EKG - N/A Stress Test - N/A ECHO - N/A Cardiac Cath - N/A Pacemaker/ICD device last checked: Spinal Cord Stimulator:  Sleep Study - N/A CPAP -   Fasting Blood Sugar - N/A Checks Blood Sugar _____ times a day  Blood Thinner Instructions: N/A Aspirin Instructions: Last Dose:  Activity level:  Unable to go up a flight of stairs without symptoms of joint pain.  Patient lives alone and is able to perform ADLs independently without chest pain or shortness of breath.    Anesthesia review: N/A  Patient denies shortness of breath, fever, cough and chest pain at PAT appointment (completed over the phone)   Patient verbalized understanding of instructions that were given to them at the PAT appointment. Patient was also instructed that they will need to review over the PAT instructions again at home before surgery.

## 2021-02-22 ENCOUNTER — Encounter (HOSPITAL_COMMUNITY): Payer: Self-pay | Admitting: Orthopedic Surgery

## 2021-02-22 ENCOUNTER — Other Ambulatory Visit (HOSPITAL_COMMUNITY)
Admission: RE | Admit: 2021-02-22 | Discharge: 2021-02-22 | Disposition: A | Discharge status: Home / Self Care | Payer: Medicare Other | Source: Ambulatory Visit | Attending: Orthopedic Surgery | Admitting: Orthopedic Surgery

## 2021-02-22 DIAGNOSIS — Z20822 Contact with and (suspected) exposure to covid-19: Secondary | ICD-10-CM | POA: Diagnosis not present

## 2021-02-22 DIAGNOSIS — Z01812 Encounter for preprocedural laboratory examination: Secondary | ICD-10-CM | POA: Insufficient documentation

## 2021-02-22 LAB — SARS CORONAVIRUS 2 (TAT 6-24 HRS): SARS Coronavirus 2: NEGATIVE

## 2021-02-22 NOTE — H&P (Signed)
TOTAL HIP ADMISSION H&P  Patient is admitted for left total hip arthroplasty.  Subjective:  Chief Complaint: Left hip pain  HPI: Kenneth Roy, 87 y.o. male, has a history of pain and functional disability in the left hip due to arthritis and patient has failed non-surgical conservative treatments for greater than 12 weeks to include NSAID's and/or analgesics and activity modification. Onset of symptoms was abrupt, starting 2 years ago with gradually worsening course since that time. The patient noted no past surgery on the left hip. Patient currently rates pain in the left hip at 8 out of 10 with activity. Patient has worsening of pain with activity and weight bearing, pain that interfers with activities of daily living and pain with passive range of motion. Patient has evidence of severe bone-on-bone arthritis with some erosion of the femoral head by imaging studies. This condition presents safety issues increasing the risk of falls. There is no current active infection.  There are no problems to display for this patient.   Past Medical History:  Diagnosis Date  . Hypertension   . Osteoarthritis   . Rheumatoid arthritis [facility])     Past Surgical History:  Procedure Laterality Date  . ANKLE SURGERY Left   . EYE SURGERY    . INNER EAR SURGERY Bilateral   . JOINT REPLACEMENT    . RETINAL DETACHMENT SURGERY Right   . TONSILLECTOMY    . WRIST SURGERY      Prior to Admission medications   Medication Sig Start Date End Date Taking? Authorizing Provider  cholecalciferol (VITAMIN D3) 25 MCG (1000 UNIT) tablet Take 1,000 Units by mouth daily.   Yes [provider]  lisinopril (ZESTRIL) 20 MG tablet Take 20 mg by mouth daily. 01/11/21  Yes [provider]  Multiple Vitamins-Minerals (CENTRUM SILVER PO) Take 1 tablet by mouth daily.   Yes [provider]  naproxen sodium (ALEVE) 220 MG tablet Take 660 mg by mouth daily as needed (pain).   Yes [provider]   sulfaSALAzine (AZULFIDINE) 500 MG tablet Take 1,000 mg by mouth 2 (two) times daily.   Yes [provider]  traMADol (ULTRAM) 50 MG tablet Take 50-100 mg by mouth 2 (two) times daily as needed for pain. 02/19/21  Yes [provider]    No Known Allergies  Social History   Socioeconomic History  . Marital status: Widowed    Spouse name: Not on file  . Number of children: Not on file  . Years of education: Not on file  . Highest education level: Not on file  Occupational History  . Not on file  Tobacco Use  . Smoking status: Former Games developer  . Smokeless tobacco: Never Used  Vaping Use  . Vaping Use: Never used  Substance and Sexual Activity  . Alcohol use: Yes    Comment: Beer and wine  . Drug use: Never  . Sexual activity: Not on file  Other Topics Concern  . Not on file  Social History Narrative  . Not on file   Social Determinants of Health   Financial Resource Strain: Not on file  Food Insecurity: Not on file  Transportation Needs: Not on file  Physical Activity: Not on file  Stress: Not on file  Social Connections: Not on file  Intimate Partner Violence: Not on file    Tobacco Use: Medium Risk  . Smoking Tobacco Use: Former Smoker  . Smokeless Tobacco Use: Never Used   Social History   Substance and  Sexual Activity  Alcohol Use Yes   Comment: Beer and wine    History reviewed. No pertinent family history.  Review of Systems  Constitutional: Negative for chills and fever.  HENT: Negative for congestion, sore throat and tinnitus.   Eyes: Negative for double vision, photophobia and pain.  Respiratory: Negative for cough, shortness of breath and wheezing.   Cardiovascular: Negative for chest pain, palpitations and orthopnea.  Gastrointestinal: Negative for heartburn, nausea and vomiting.  Genitourinary: Negative for dysuria, frequency and urgency.  Musculoskeletal: Positive for joint pain.  Neurological: Negative for dizziness, weakness  and headaches.     Objective:  Physical Exam: Well nourished and well developed.  General: Alert and oriented x3, cooperative and pleasant, no acute distress.  Head: normocephalic, atraumatic, neck supple.  Eyes: EOMI.  Respiratory: breath sounds clear in all fields, no wheezing, rales, or rhonchi. Cardiovascular: Regular rate and rhythm, no murmurs, gallops or rubs.  Abdomen: non-tender to palpation and soft, normoactive bowel sounds. Musculoskeletal:  Left Hip Exam:  ROM: Flexion to 100, Internal Rotation 0, External Rotation 10, and abduction 30 without discomfort.  There is no tenderness over the greater trochanter.   Calves soft and nontender. Motor function intact in LE. Strength 5/5 LE bilaterally. Neuro: Distal pulses 2+. Sensation to light touch intact in LE.  Imaging Review Plain radiographs demonstrate severe degenerative joint disease of the left hip. The bone quality appears to be adequate for age and reported activity level.  Assessment/Plan:  End stage arthritis, left hip  The patient history, physical examination, clinical judgement of the provider and imaging studies are consistent with end stage degenerative joint disease of the left hip and total hip arthroplasty is deemed medically necessary. The treatment options including medical management, injection therapy, arthroscopy and arthroplasty were discussed at length. The risks and benefits of total hip arthroplasty were presented and reviewed. The risks due to aseptic loosening, infection, stiffness, dislocation/subluxation, thromboembolic complications and other imponderables were discussed. The patient acknowledged the explanation, agreed to proceed with the plan and consent was signed. Patient is being admitted for inpatient treatment for surgery, pain control, PT, OT, prophylactic antibiotics, VTE prophylaxis, progressive ambulation and ADLs and discharge planning.The patient is planning to be discharged  home.   Patient's anticipated LOS is less than 2 midnights, meeting these requirements: - Younger than 38 - Lives within 1 hour of care - Has a competent adult at home to recover with post-op recover - NO history of  - Chronic pain requiring opiods  - Diabetes  - Coronary Artery Disease  - Heart failure  - Heart attack  - Stroke  - DVT/VTE  - Cardiac arrhythmia  - Respiratory Failure/COPD  - Renal failure  - Anemia  - Advanced Liver disease  Therapy Plans: HEP Disposition: Home with son nearby Planned DVT Prophylaxis: Aspirin 325 mg BID DME Needed: None PCP: Guerry Bruin, MD  TXA: IV Allergies: NKDA Anesthesia Concerns: None BMI: 23.2 Last HgbA1c: Not diabetic Pharmacy: [facility])  Other: Taking clearance form by Dr. Deneen Harts office today   - Patient was instructed on what medications to stop prior to surgery. - Follow-up visit in 2 weeks with Dr. Lequita Halt - Begin physical therapy following surgery - Pre-operative lab work as pre-surgical testing - Prescriptions will be provided in hospital at time of discharge  Arther Abbott, PA-C Orthopedic Surgery [facility] [place]

## 2021-02-26 ENCOUNTER — Ambulatory Visit (HOSPITAL_COMMUNITY): Payer: Medicare Other | Admitting: Certified Registered"

## 2021-02-26 ENCOUNTER — Encounter (HOSPITAL_COMMUNITY): Payer: Self-pay | Admitting: Orthopedic Surgery

## 2021-02-26 ENCOUNTER — Other Ambulatory Visit: Payer: Self-pay

## 2021-02-26 ENCOUNTER — Inpatient Hospital Stay (HOSPITAL_COMMUNITY)
Admission: RE | Admit: 2021-02-26 | Discharge: 2021-03-02 | DRG: 470 | Disposition: A | Discharge status: Home / Self Care | Payer: Medicare Other | Source: Ambulatory Visit | Attending: Orthopedic Surgery | Admitting: Orthopedic Surgery

## 2021-02-26 ENCOUNTER — Observation Stay (HOSPITAL_COMMUNITY): Payer: Medicare Other

## 2021-02-26 ENCOUNTER — Encounter (HOSPITAL_COMMUNITY)
Admission: RE | Disposition: A | Discharge status: Home / Self Care | Payer: Self-pay | Source: Ambulatory Visit | Attending: Orthopedic Surgery

## 2021-02-26 ENCOUNTER — Ambulatory Visit (HOSPITAL_COMMUNITY): Payer: Medicare Other

## 2021-02-26 DIAGNOSIS — M1612 Unilateral primary osteoarthritis, left hip: Principal | ICD-10-CM | POA: Diagnosis present

## 2021-02-26 DIAGNOSIS — I10 Essential (primary) hypertension: Secondary | ICD-10-CM | POA: Diagnosis present

## 2021-02-26 DIAGNOSIS — Z79899 Other long term (current) drug therapy: Secondary | ICD-10-CM | POA: Diagnosis not present

## 2021-02-26 DIAGNOSIS — M069 Rheumatoid arthritis, unspecified: Secondary | ICD-10-CM | POA: Diagnosis not present

## 2021-02-26 DIAGNOSIS — Z96642 Presence of left artificial hip joint: Secondary | ICD-10-CM | POA: Diagnosis not present

## 2021-02-26 DIAGNOSIS — Z87891 Personal history of nicotine dependence: Secondary | ICD-10-CM | POA: Diagnosis not present

## 2021-02-26 DIAGNOSIS — Z96649 Presence of unspecified artificial hip joint: Secondary | ICD-10-CM

## 2021-02-26 DIAGNOSIS — M169 Osteoarthritis of hip, unspecified: Secondary | ICD-10-CM | POA: Diagnosis present

## 2021-02-26 DIAGNOSIS — Z20822 Contact with and (suspected) exposure to covid-19: Secondary | ICD-10-CM | POA: Diagnosis not present

## 2021-02-26 DIAGNOSIS — Z419 Encounter for procedure for purposes other than remedying health state, unspecified: Secondary | ICD-10-CM

## 2021-02-26 DIAGNOSIS — Z471 Aftercare following joint replacement surgery: Secondary | ICD-10-CM | POA: Diagnosis not present

## 2021-02-26 HISTORY — DX: Essential (primary) hypertension: I10

## 2021-02-26 HISTORY — DX: Rheumatoid arthritis, unspecified: M06.9

## 2021-02-26 HISTORY — DX: Unspecified osteoarthritis, unspecified site: M19.90

## 2021-02-26 HISTORY — PX: TOTAL HIP ARTHROPLASTY: SHX124

## 2021-02-26 LAB — TYPE AND SCREEN
ABO/RH(D): B POS
Antibody Screen: NEGATIVE

## 2021-02-26 LAB — SURGICAL PCR SCREEN
MRSA, PCR: NEGATIVE
Staphylococcus aureus: NEGATIVE

## 2021-02-26 LAB — CBC
HCT: 43.4 % (ref 39.0–52.0)
Hemoglobin: 14.3 g/dL (ref 13.0–17.0)
MCH: 33.6 pg (ref 26.0–34.0)
MCHC: 32.9 g/dL (ref 30.0–36.0)
MCV: 102.1 fL — ABNORMAL HIGH (ref 80.0–100.0)
Platelets: 174 10*3/uL (ref 150–400)
RBC: 4.25 MIL/uL (ref 4.22–5.81)
RDW: 14.3 % (ref 11.5–15.5)
WBC: 6.4 10*3/uL (ref 4.0–10.5)
nRBC: 0 % (ref 0.0–0.2)

## 2021-02-26 LAB — COMPREHENSIVE METABOLIC PANEL
ALT: 18 U/L (ref 0–44)
AST: 21 U/L (ref 15–41)
Albumin: 3.5 g/dL (ref 3.5–5.0)
Alkaline Phosphatase: 126 U/L (ref 38–126)
Anion gap: 8 (ref 5–15)
BUN: 12 mg/dL (ref 8–23)
CO2: 27 mmol/L (ref 22–32)
Calcium: 9 mg/dL (ref 8.9–10.3)
Chloride: 103 mmol/L (ref 98–111)
Creatinine, Ser: 0.67 mg/dL (ref 0.61–1.24)
GFR, Estimated: >60 mL/min (ref >60)
Glucose, Bld: 109 mg/dL — ABNORMAL HIGH (ref 70–99)
Potassium: 3.9 mmol/L (ref 3.5–5.1)
Sodium: 138 mmol/L (ref 135–145)
Total Bilirubin: 0.7 mg/dL (ref 0.3–1.2)
Total Protein: 6.6 g/dL (ref 6.5–8.1)

## 2021-02-26 LAB — PROTIME-INR
INR: 0.9 (ref 0.8–1.2)
Prothrombin Time: 12.2 seconds (ref 11.4–15.2)

## 2021-02-26 LAB — ABO/RH: ABO/RH(D): B POS

## 2021-02-26 SURGERY — ARTHROPLASTY, HIP, TOTAL, ANTERIOR APPROACH
Anesthesia: Spinal | Site: Hip | Laterality: Left

## 2021-02-26 MED ORDER — BUPIVACAINE-EPINEPHRINE (PF) 0.25% -1:200000 IJ SOLN
INTRAMUSCULAR | Status: DC | PRN
  Administered 2021-02-26: 30 mL

## 2021-02-26 MED ORDER — PHENYLEPHRINE 40 MCG/ML (10ML) SYRINGE FOR IV PUSH (FOR BLOOD PRESSURE SUPPORT)
PREFILLED_SYRINGE | INTRAVENOUS | Status: DC | PRN
  Administered 2021-02-26: 120 ug via INTRAVENOUS
  Administered 2021-02-26: 80 ug via INTRAVENOUS
  Administered 2021-02-26 (×2): 120 ug via INTRAVENOUS
  Administered 2021-02-26: 80 ug via INTRAVENOUS
  Administered 2021-02-26: 120 ug via INTRAVENOUS

## 2021-02-26 MED ORDER — DEXAMETHASONE SODIUM PHOSPHATE 10 MG/ML IJ SOLN
8.0000 mg | Freq: Once | INTRAMUSCULAR | Status: AC
  Administered 2021-02-26: 8 mg via INTRAVENOUS

## 2021-02-26 MED ORDER — METHOCARBAMOL 500 MG IVPB - SIMPLE MED
500.0000 mg | Freq: Four times a day (QID) | INTRAVENOUS | Status: DC | PRN
  Filled 2021-02-26: qty 50

## 2021-02-26 MED ORDER — FENTANYL CITRATE (PF) 100 MCG/2ML IJ SOLN: 25.0000 ug | INTRAMUSCULAR | Status: DC | PRN

## 2021-02-26 MED ORDER — DOCUSATE SODIUM 100 MG PO CAPS
100.0000 mg | ORAL_CAPSULE | Freq: Two times a day (BID) | ORAL | Status: DC
  Administered 2021-02-26 – 2021-03-02 (×5): 100 mg via ORAL
  Filled 2021-02-26 (×8): qty 1

## 2021-02-26 MED ORDER — PHENYLEPHRINE 40 MCG/ML (10ML) SYRINGE FOR IV PUSH (FOR BLOOD PRESSURE SUPPORT)
PREFILLED_SYRINGE | INTRAVENOUS | Status: AC
  Filled 2021-02-26: qty 10

## 2021-02-26 MED ORDER — TRAMADOL HCL 50 MG PO TABS
50.0000 mg | ORAL_TABLET | Freq: Four times a day (QID) | ORAL | Status: DC | PRN
  Administered 2021-02-27: 50 mg via ORAL
  Administered 2021-02-27 (×2): 100 mg via ORAL
  Administered 2021-02-28 – 2021-03-01 (×3): 50 mg via ORAL
  Filled 2021-02-26: qty 1
  Filled 2021-02-26: qty 2
  Filled 2021-02-26: qty 1
  Filled 2021-02-26: qty 2
  Filled 2021-02-26 (×2): qty 1

## 2021-02-26 MED ORDER — PROPOFOL 500 MG/50ML IV EMUL
INTRAVENOUS | Status: DC | PRN
  Administered 2021-02-26: 75 ug/kg/min via INTRAVENOUS

## 2021-02-26 MED ORDER — PHENOL 1.4 % MT LIQD
1.0000 | OROMUCOSAL | Status: DC | PRN
Start: 2021-02-26 — End: 2021-03-02

## 2021-02-26 MED ORDER — HYDROCODONE-ACETAMINOPHEN 5-325 MG PO TABS: 1.0000 | ORAL_TABLET | ORAL | Status: DC | PRN

## 2021-02-26 MED ORDER — CHLORHEXIDINE GLUCONATE 0.12 % MT SOLN
15.0000 mL | Freq: Once | OROMUCOSAL | Status: AC
  Administered 2021-02-26: 15 mL via OROMUCOSAL

## 2021-02-26 MED ORDER — MENTHOL 3 MG MT LOZG: 1.0000 | LOZENGE | OROMUCOSAL | Status: DC | PRN

## 2021-02-26 MED ORDER — BUPIVACAINE-EPINEPHRINE (PF) 0.25% -1:200000 IJ SOLN
INTRAMUSCULAR | Status: AC
  Filled 2021-02-26: qty 30

## 2021-02-26 MED ORDER — LACTATED RINGERS IV SOLN: INTRAVENOUS | Status: DC

## 2021-02-26 MED ORDER — ASPIRIN EC 325 MG PO TBEC
325.0000 mg | DELAYED_RELEASE_TABLET | Freq: Two times a day (BID) | ORAL | Status: DC
  Administered 2021-02-27 – 2021-03-02 (×7): 325 mg via ORAL
  Filled 2021-02-26 (×7): qty 1

## 2021-02-26 MED ORDER — POVIDONE-IODINE 10 % EX SWAB
2.0000 | Freq: Once | CUTANEOUS | Status: AC
Start: 2021-02-26 — End: 2021-02-26
  Administered 2021-02-26: 2 via TOPICAL

## 2021-02-26 MED ORDER — ONDANSETRON HCL 4 MG PO TABS: 4.0000 mg | ORAL_TABLET | Freq: Four times a day (QID) | ORAL | Status: DC | PRN

## 2021-02-26 MED ORDER — BISACODYL 10 MG RE SUPP: 10.0000 mg | Freq: Every day | RECTAL | Status: DC | PRN

## 2021-02-26 MED ORDER — METOCLOPRAMIDE HCL 5 MG PO TABS: 5.0000 mg | ORAL_TABLET | Freq: Three times a day (TID) | ORAL | Status: DC | PRN

## 2021-02-26 MED ORDER — DEXAMETHASONE SODIUM PHOSPHATE 10 MG/ML IJ SOLN
10.0000 mg | Freq: Once | INTRAMUSCULAR | Status: AC
  Administered 2021-02-27: 10 mg via INTRAVENOUS
  Filled 2021-02-26: qty 1

## 2021-02-26 MED ORDER — 0.9 % SODIUM CHLORIDE (POUR BTL) OPTIME
TOPICAL | Status: DC | PRN
  Administered 2021-02-26: 1000 mL

## 2021-02-26 MED ORDER — ONDANSETRON HCL 4 MG/2ML IJ SOLN
INTRAMUSCULAR | Status: DC | PRN
  Administered 2021-02-26: 4 mg via INTRAVENOUS

## 2021-02-26 MED ORDER — MORPHINE SULFATE (PF) 2 MG/ML IV SOLN: 0.5000 mg | INTRAVENOUS | Status: DC | PRN

## 2021-02-26 MED ORDER — ORAL CARE MOUTH RINSE: 15.0000 mL | Freq: Once | OROMUCOSAL | Status: AC

## 2021-02-26 MED ORDER — SODIUM CHLORIDE 0.9 % IV SOLN: INTRAVENOUS | Status: DC

## 2021-02-26 MED ORDER — ONDANSETRON HCL 4 MG/2ML IJ SOLN
INTRAMUSCULAR | Status: AC
  Filled 2021-02-26: qty 2

## 2021-02-26 MED ORDER — CEFAZOLIN SODIUM-DEXTROSE 2-4 GM/100ML-% IV SOLN
2.0000 g | Freq: Four times a day (QID) | INTRAVENOUS | Status: AC
  Administered 2021-02-26 – 2021-02-27 (×2): 2 g via INTRAVENOUS
  Filled 2021-02-26 (×2): qty 100

## 2021-02-26 MED ORDER — FENTANYL CITRATE (PF) 100 MCG/2ML IJ SOLN
INTRAMUSCULAR | Status: DC | PRN
  Administered 2021-02-26: 25 ug via INTRAVENOUS

## 2021-02-26 MED ORDER — POLYETHYLENE GLYCOL 3350 17 G PO PACK: 17.0000 g | PACK | Freq: Every day | ORAL | Status: DC | PRN

## 2021-02-26 MED ORDER — METHOCARBAMOL 500 MG PO TABS
500.0000 mg | ORAL_TABLET | Freq: Four times a day (QID) | ORAL | Status: DC | PRN
  Administered 2021-02-27: 500 mg via ORAL
  Filled 2021-02-26 (×2): qty 1

## 2021-02-26 MED ORDER — ACETAMINOPHEN 10 MG/ML IV SOLN
1000.0000 mg | Freq: Four times a day (QID) | INTRAVENOUS | Status: DC
  Filled 2021-02-26: qty 100

## 2021-02-26 MED ORDER — METOCLOPRAMIDE HCL 5 MG/ML IJ SOLN: 5.0000 mg | Freq: Three times a day (TID) | INTRAMUSCULAR | Status: DC | PRN

## 2021-02-26 MED ORDER — MAGNESIUM CITRATE PO SOLN: 1.0000 | Freq: Once | ORAL | Status: DC | PRN

## 2021-02-26 MED ORDER — PROPOFOL 1000 MG/100ML IV EMUL
INTRAVENOUS | Status: AC
  Filled 2021-02-26: qty 100

## 2021-02-26 MED ORDER — ONDANSETRON HCL 4 MG/2ML IJ SOLN: 4.0000 mg | Freq: Four times a day (QID) | INTRAMUSCULAR | Status: DC | PRN

## 2021-02-26 MED ORDER — DEXAMETHASONE SODIUM PHOSPHATE 10 MG/ML IJ SOLN
INTRAMUSCULAR | Status: AC
  Filled 2021-02-26: qty 1

## 2021-02-26 MED ORDER — BUPIVACAINE IN DEXTROSE 0.75-8.25 % IT SOLN
INTRATHECAL | Status: DC | PRN
  Administered 2021-02-26: 13.5 mg via INTRATHECAL

## 2021-02-26 MED ORDER — TRANEXAMIC ACID-NACL 1000-0.7 MG/100ML-% IV SOLN
1000.0000 mg | INTRAVENOUS | Status: AC
  Administered 2021-02-26: 1000 mg via INTRAVENOUS
  Filled 2021-02-26: qty 100

## 2021-02-26 MED ORDER — FENTANYL CITRATE (PF) 100 MCG/2ML IJ SOLN
INTRAMUSCULAR | Status: AC
  Filled 2021-02-26: qty 2

## 2021-02-26 MED ORDER — CEFAZOLIN SODIUM-DEXTROSE 2-4 GM/100ML-% IV SOLN
2.0000 g | INTRAVENOUS | Status: AC
  Administered 2021-02-26: 2 g via INTRAVENOUS
  Filled 2021-02-26: qty 100

## 2021-02-26 MED ORDER — BUPIVACAINE-EPINEPHRINE 0.25% -1:200000 IJ SOLN
INTRAMUSCULAR | Status: AC
  Filled 2021-02-26: qty 1

## 2021-02-26 SURGICAL SUPPLY — 42 items
BAG DECANTER FOR FLEXI CONT (MISCELLANEOUS) IMPLANT
BAG ZIPLOCK 12X15 (MISCELLANEOUS) ×2 IMPLANT
BLADE SAG 18X100X1.27 (BLADE) ×2 IMPLANT
COVER PERINEAL POST (MISCELLANEOUS) ×2 IMPLANT
COVER SURGICAL LIGHT HANDLE (MISCELLANEOUS) ×2 IMPLANT
COVER WAND RF STERILE (DRAPES) IMPLANT
CUP ACET PINNACLE SECTR 50MM (Hips) ×1 IMPLANT
DECANTER SPIKE VIAL GLASS SM (MISCELLANEOUS) IMPLANT
DRAPE STERI IOBAN 125X83 (DRAPES) ×2 IMPLANT
DRAPE U-SHAPE 47X51 STRL (DRAPES) ×4 IMPLANT
DRSG AQUACEL AG ADV 3.5X10 (GAUZE/BANDAGES/DRESSINGS) ×2 IMPLANT
DURAPREP 26ML APPLICATOR (WOUND CARE) ×2 IMPLANT
ELECT REM PT RETURN 15FT ADLT (MISCELLANEOUS) ×2 IMPLANT
FACESHIELD WRAPAROUND (MASK) ×2 IMPLANT
GLOVE SRG 8 PF TXTR STRL LF DI (GLOVE) ×1 IMPLANT
GLOVE SURG ENC MOIS LTX SZ6.5 (GLOVE) ×2 IMPLANT
GLOVE SURG ENC MOIS LTX SZ7 (GLOVE) ×2 IMPLANT
GLOVE SURG ENC MOIS LTX SZ8 (GLOVE) ×4 IMPLANT
GLOVE SURG UNDER POLY LF SZ7 (GLOVE) ×2 IMPLANT
GLOVE SURG UNDER POLY LF SZ8 (GLOVE) ×1
GLOVE SURG UNDER POLY LF SZ8.5 (GLOVE) IMPLANT
GOWN STRL REUS W/TWL LRG LVL3 (GOWN DISPOSABLE) ×4 IMPLANT
GOWN STRL REUS W/TWL XL LVL3 (GOWN DISPOSABLE) IMPLANT
HEAD FEM STD 32X+5 STRL (Hips) ×2 IMPLANT
HOLDER FOLEY CATH W/STRAP (MISCELLANEOUS) ×2 IMPLANT
KIT TURNOVER KIT A (KITS) ×2 IMPLANT
LINER MARATHON 32 50 (Hips) ×2 IMPLANT
MANIFOLD NEPTUNE II (INSTRUMENTS) ×2 IMPLANT
PACK ANTERIOR HIP CUSTOM (KITS) ×2 IMPLANT
PENCIL SMOKE EVACUATOR COATED (MISCELLANEOUS) ×2 IMPLANT
PINNACLE SECTOR CUP 50MM (Hips) ×2 IMPLANT
STEM FEM ACTIS STD SZ7 (Nail) ×2 IMPLANT
STRIP CLOSURE SKIN 1/2X4 (GAUZE/BANDAGES/DRESSINGS) ×4 IMPLANT
SUT ETHIBOND NAB CT1 #1 30IN (SUTURE) ×2 IMPLANT
SUT MNCRL AB 4-0 PS2 18 (SUTURE) ×2 IMPLANT
SUT STRATAFIX 0 PDS 27 VIOLET (SUTURE) ×2
SUT VIC AB 2-0 CT1 27 (SUTURE) ×2
SUT VIC AB 2-0 CT1 TAPERPNT 27 (SUTURE) ×2 IMPLANT
SUTURE STRATFX 0 PDS 27 VIOLET (SUTURE) ×1 IMPLANT
SYR 50ML LL SCALE MARK (SYRINGE) IMPLANT
TRAY FOLEY MTR SLVR 16FR STAT (SET/KITS/TRAYS/PACK) ×2 IMPLANT
TUBE SUCTION HIGH CAP CLEAR NV (SUCTIONS) ×2 IMPLANT

## 2021-02-26 NOTE — Interval H&P Note (Signed)
History and Physical Interval Note:  02/26/2021 11:53 AM  Kenneth Roy  has presented today for surgery, with the diagnosis of left hip osteoarthritis.  The various methods of treatment have been discussed with the patient and family. After consideration of risks, benefits and other options for treatment, the patient has consented to  Procedure(s) with comments: TOTAL HIP ARTHROPLASTY ANTERIOR APPROACH (Left) - as a surgical intervention.  The patient's history has been reviewed, patient examined, no change in status, stable for surgery.  I have reviewed the patient's chart and labs.  Questions were answered to the patient's satisfaction.     Kenneth Roy

## 2021-02-26 NOTE — Care Plan (Signed)
Ortho Bundle Case Management Note  Patient Details  Name: Kenneth Roy MRN: [id number] Date of Birth: 1927/01/09  L THA on 02-26-21 DCP:  Home with son.  2 story home with ramp entrance.  DME:  No needs.  Has a RW and toilet seat elevator. PT:  HEP                   DME Arranged:  N/A DME Agency:  NA  HH Arranged:  NA HH Agency:  NA  Additional Comments: Please contact me with any questions of if this plan should need to change.  Ennis Forts, RN,CCM [facility]  [phone] 02/26/2021, 4:17 PM

## 2021-02-26 NOTE — Anesthesia Procedure Notes (Signed)
Spinal  Patient location during procedure: OR End time: 02/26/2021 2:13 PM Reason for block: surgical anesthesia Staffing Performed: anesthesiologist  Anesthesiologist: Jairo Ben, MD Preanesthetic Checklist Completed: patient identified, IV checked, site marked, risks and benefits discussed, surgical consent, monitors and equipment checked, pre-op evaluation and timeout performed Spinal Block Patient position: sitting Prep: DuraPrep and site prepped and draped Patient monitoring: blood pressure, continuous pulse ox, cardiac monitor and heart rate Approach: midline Location: L3-4 Injection technique: single-shot Needle Needle type: Pencan and Introducer  Needle gauge: 24 G Needle length: 9 cm Assessment Events: CSF return Additional Notes Pt identified in Operating room.  Monitors applied. Working IV access confirmed. Sterile prep, drape lumbar spine.  1% lido local L 3,4.  #24ga Pencan into clear CSF L 3,4.  13.5mg  0.75% Bupivacaine with dextrose injected with asp CSF beginning and end of injection.  Patient asymptomatic, VSS, no heme aspirated, tolerated well.  Sandford Craze, MD

## 2021-02-26 NOTE — Anesthesia Postprocedure Evaluation (Signed)
Anesthesia Post Note  Patient: Kenneth Roy  Procedure(s) Performed: TOTAL HIP ARTHROPLASTY ANTERIOR APPROACH (Left Hip)     Patient location during evaluation: PACU Anesthesia Type: Spinal Level of consciousness: awake and alert, patient cooperative and oriented Pain management: pain level controlled Vital Signs Assessment: post-procedure vital signs reviewed and stable Respiratory status: nonlabored ventilation, spontaneous breathing and respiratory function stable Cardiovascular status: blood pressure returned to baseline and stable Postop Assessment: no apparent nausea or vomiting and spinal receding Anesthetic complications: no   No complications documented.  Last Vitals:  Vitals:   02/26/21 1645 02/26/21 1700  BP: 123/65 127/62  Pulse: (!) 58 (!) 58  Resp: 15 14  Temp:    SpO2: 100% 100%    Last Pain:  Vitals:   02/26/21 1645  TempSrc:   PainSc: 0-No pain                 Laronn Devonshire,E. Sanna Porcaro

## 2021-02-26 NOTE — Op Note (Signed)
OPERATIVE REPORT- TOTAL HIP ARTHROPLASTY   PREOPERATIVE DIAGNOSIS: Osteoarthritis of the Left hip.   POSTOPERATIVE DIAGNOSIS: Osteoarthritis of the Left  hip.   PROCEDURE: Left total hip arthroplasty, anterior approach.   SURGEON: Ollen Gross, MD   ASSISTANT: Arther Abbott, PA-C  ANESTHESIA:  Spinal  ESTIMATED BLOOD LOSS:- 250 ml  DRAINS: Hemovac x1.   COMPLICATIONS: None   CONDITION: PACU - hemodynamically stable.   BRIEF CLINICAL NOTE: Kenneth Roy is a 87 y.o. male who has advanced end-  stage arthritis of their Left  hip with progressively worsening pain and  dysfunction.The patient has failed nonoperative management and presents for  total hip arthroplasty.   PROCEDURE IN DETAIL: After successful administration of spinal  anesthetic, the traction boots for the [facility] bed were placed on both  feet and the patient was placed onto the [facility] bed, boots placed into the leg  holders. The Left hip was then isolated from the perineum with plastic  drapes and prepped and draped in the usual sterile fashion. ASIS and  greater trochanter were marked and a oblique incision was made, starting  at about 1 cm lateral and 2 cm distal to the ASIS and coursing towards  the anterior cortex of the femur. The skin was cut with a 10 blade  through subcutaneous tissue to the level of the fascia overlying the  tensor fascia lata muscle. The fascia was then incised in line with the  incision at the junction of the anterior third and posterior 2/3rd. The  muscle was teased off the fascia and then the interval between the TFL  and the rectus was developed. The Hohmann retractor was then placed at  the top of the femoral neck over the capsule. The vessels overlying the  capsule were cauterized and the fat on top of the capsule was removed.  A Hohmann retractor was then placed anterior underneath the rectus  femoris to give exposure to the entire anterior capsule. A T-shaped   capsulotomy was performed. The edges were tagged and the femoral head  was identified.       Osteophytes are removed off the superior acetabulum.  The femoral neck was then cut in situ with an oscillating saw. Traction  was then applied to the left lower extremity utilizing the [facility]  traction. The femoral head was then removed. Retractors were placed  around the acetabulum and then circumferential removal of the labrum was  performed. Osteophytes were also removed. Reaming starts at 45 mm to  medialize and  Increased in 2 mm increments to 49 mm. We reamed in  approximately 40 degrees of abduction, 20 degrees anteversion. A 50 mm  pinnacle acetabular shell was then impacted in anatomic position under  fluoroscopic guidance with excellent purchase. We did not need to place  any additional dome screws. A 32 mm neutral + 4 marathon liner was then  placed into the acetabular shell.       The femoral lift was then placed along the lateral aspect of the femur  just distal to the vastus ridge. The leg was  externally rotated and capsule  was stripped off the inferior aspect of the femoral neck down to the  level of the lesser trochanter, this was done with electrocautery. The femur was lifted after this was performed. The  leg was then placed in an extended and adducted position essentially delivering the femur. We also removed the capsule superiorly and the piriformis from the piriformis fossa  to gain excellent exposure of the  proximal femur. Rongeur was used to remove some cancellous bone to get  into the lateral portion of the proximal femur for placement of the  initial starter reamer. The starter broaches was placed  the starter broach  and was shown to go down the center of the canal. Broaching  with the Actis system was then performed starting at size 0  coursing  Up to size 7. A size 7 had excellent torsional and rotational  and axial stability. The trial standard offset neck was then  placed  with a 32 + 5 trial head. The hip was then reduced. We confirmed that  the stem was in the canal both on AP and lateral x-rays. It also has excellent sizing. The hip was reduced with outstanding stability through full extension and full external rotation.. AP pelvis was taken and the leg lengths were measured and found to be equal. Hip was then dislocated again and the femoral head and neck removed. The  femoral broach was removed. Size 7 Actis stem with a standard offset  neck was then impacted into the femur following native anteversion. Has  excellent purchase in the canal. Excellent torsional and rotational and  axial stability. It is confirmed to be in the canal on AP and lateral  fluoroscopic views. The 32 + 5 metal head was placed and the hip  reduced with outstanding stability. Again AP pelvis was taken and it  confirmed that the leg lengths were equal. The wound was then copiously  irrigated with saline solution and the capsule reattached and repaired  with Ethibond suture. 30 ml of .25% Bupivicaine was  injected into the capsule and into the edge of the tensor fascia lata as well as subcutaneous tissue. The fascia overlying the tensor fascia lata was then closed with a running #1 V-Loc. Subcu was closed with interrupted 2-0 Vicryl and subcuticular running 4-0 Monocryl. Incision was cleaned  and dried. Steri-Strips and a bulky sterile dressing applied. Hemovac  drain was hooked to suction and then the patient was awakened and transported to  recovery in stable condition.        Please note that a surgical assistant was a medical necessity for this procedure to perform it in a safe and expeditious manner. Assistant was necessary to provide appropriate retraction of vital neurovascular structures and to prevent femoral fracture and allow for anatomic placement of the prosthesis.  Gaynelle Arabian, M.D.

## 2021-02-26 NOTE — Anesthesia Preprocedure Evaluation (Addendum)
Anesthesia Evaluation  Patient identified by MRN, date of birth, ID band Patient awake    Reviewed: Allergy & Precautions, NPO status , Patient's Chart, lab work & pertinent test results  History of Anesthesia Complications Negative for: history of anesthetic complications  Airway Mallampati: II  TM Distance: >3 FB Neck ROM: Full    Dental  (+) Dental Advisory Given   Pulmonary former smoker,  02/22/2021 SARS coronavirus NEG   Pulmonary exam normal        Cardiovascular hypertension, Pt. on medications (-) angina Rhythm:Regular Rate:Normal     Neuro/Psych negative neurological ROS     GI/Hepatic negative GI ROS, Neg liver ROS,   Endo/Other  negative endocrine ROS  Renal/GU negative Renal ROS     Musculoskeletal  (+) Arthritis , Osteoarthritis and Rheumatoid disorders,    Abdominal   Peds  Hematology negative hematology ROS (+)   Anesthesia Other Findings   Reproductive/Obstetrics                            Anesthesia Physical Anesthesia Plan  ASA: III  Anesthesia Plan: Spinal   Post-op Pain Management:    Induction:   PONV Risk Score and Plan: 1 and Ondansetron and Treatment may vary due to age or medical condition  Airway Management Planned: Natural Airway and Simple Face Mask  Additional Equipment: None  Intra-op Plan:   Post-operative Plan:   Informed Consent: I have reviewed the patients History and Physical, chart, labs and discussed the procedure including the risks, benefits and alternatives for the proposed anesthesia with the patient or authorized representative who has indicated his/her understanding and acceptance.     Dental advisory given  Plan Discussed with: CRNA and Surgeon  Anesthesia Plan Comments:        Anesthesia Quick Evaluation

## 2021-02-26 NOTE — Transfer of Care (Signed)
Immediate Anesthesia Transfer of Care Note  Patient: Kenneth Roy  Procedure(s) Performed: TOTAL HIP ARTHROPLASTY ANTERIOR APPROACH (Left Hip)  Patient Location: PACU  Anesthesia Type:Spinal  Level of Consciousness: awake  Airway & Oxygen Therapy: Patient Spontanous Breathing  Post-op Assessment: Report given to RN and Post -op Vital signs reviewed and stable  Post vital signs: Reviewed and stable  Last Vitals:  Vitals Value Taken Time  BP 99/53 02/26/21 1553  Temp    Pulse 78 02/26/21 1555  Resp 14 02/26/21 1555  SpO2 100 % 02/26/21 1555  Vitals shown include unvalidated device data.  Last Pain:  Vitals:   02/26/21 1201  TempSrc: Oral  PainSc:       Patients Stated Pain Goal: 5 (02/26/21 1145)  Complications: No complications documented.

## 2021-02-26 NOTE — Discharge Instructions (Signed)
Frank Aluisio, MD Total Joint Specialist [facility] [address] [place], [place] [address] [phone]  ANTERIOR APPROACH TOTAL HIP REPLACEMENT POSTOPERATIVE DIRECTIONS     Hip Rehabilitation, Guidelines Following Surgery  The results of a hip operation are greatly improved after range of motion and muscle strengthening exercises. Follow all safety measures which are given to protect your hip. If any of these exercises cause increased pain or swelling in your joint, decrease the amount until you are comfortable again. Then slowly increase the exercises. Call your caregiver if you have problems or questions.   BLOOD CLOT PREVENTION . Take a 325 mg Aspirin two times a day for three weeks following surgery. Then take an 81 mg Aspirin once a day for three weeks. Then discontinue Aspirin. . You may resume your vitamins/supplements upon discharge from the hospital. . Do not take any NSAIDs (Advil, Aleve, Ibuprofen, Meloxicam, etc.) until you have discontinued the 325 mg Aspirin.  HOME CARE INSTRUCTIONS  . Remove items at home which could result in a fall. This includes throw rugs or furniture in walking pathways.   ICE to the affected hip as frequently as 20-30 minutes an hour and then as needed for pain and swelling. Continue to use ice on the hip for pain and swelling from surgery. You may notice swelling that will progress down to the foot and ankle. This is normal after surgery. Elevate the leg when you are not up walking on it.    Continue to use the breathing machine which will help keep your temperature down.  It is common for your temperature to cycle up and down following surgery, especially at night when you are not up moving around and exerting yourself.  The breathing machine keeps your lungs expanded and your temperature down.  DIET You may resume your previous home diet once your are discharged from the hospital.  DRESSING / WOUND CARE /  SHOWERING . You have an adhesive waterproof bandage over the incision. Leave this in place until your first follow-up appointment. Once you remove this you will not need to place another bandage.  . You may begin showering 3 days following surgery, but do not submerge the incision under water.  ACTIVITY . For the first 3-5 days, it is important to rest and keep the operative leg elevated. You should, as a general rule, rest for 50 minutes and walk/stretch for 10 minutes per hour. After 5 days, you may slowly increase activity as tolerated.  . Perform the exercises you were provided twice a day for about 15-20 minutes each session. Begin these 2 days following surgery. . Walk with your walker as instructed. Use the walker until you are comfortable transitioning to a cane. Walk with the cane in the opposite hand of the operative leg. You may discontinue the cane once you are comfortable and walking steadily. . Avoid periods of inactivity such as sitting longer than an hour when not asleep. This helps prevent blood clots.  . Do not drive a car for 6 weeks or until released by your surgeon.  . Do not drive while taking narcotics.  TED HOSE STOCKINGS Wear the elastic stockings on both legs for three weeks following surgery during the day. You may remove them at night while sleeping.  WEIGHT BEARING Weight bearing as tolerated with assist device (walker, cane, etc) as directed, use it as long as suggested by your surgeon or therapist, typically at least 4-6 weeks.  POSTOPERATIVE CONSTIPATION PROTOCOL Constipation -   defined medically as fewer than three stools per week and severe constipation as less than one stool per week.  One of the most common issues patients have following surgery is constipation.  Even if you have a regular bowel pattern at home, your normal regimen is likely to be disrupted due to multiple reasons following surgery.  Combination of anesthesia, postoperative narcotics, change in  appetite and fluid intake all can affect your bowels.  In order to avoid complications following surgery, here are some recommendations in order to help you during your recovery period.  . Colace (docusate) - Pick up an over-the-counter form of Colace or another stool softener and take twice a day as long as you are requiring postoperative pain medications.  Take with a full glass of water daily.  If you experience loose stools or diarrhea, hold the colace until you stool forms back up.  If your symptoms do not get better within 1 week or if they get worse, check with your doctor. . Dulcolax (bisacodyl) - Pick up over-the-counter and take as directed by the product packaging as needed to assist with the movement of your bowels.  Take with a full glass of water.  Use this product as needed if not relieved by Colace only.  . MiraLax (polyethylene glycol) - Pick up over-the-counter to have on hand.  MiraLax is a solution that will increase the amount of water in your bowels to assist with bowel movements.  Take as directed and can mix with a glass of water, juice, soda, coffee, or tea.  Take if you go more than two days without a movement.Do not use MiraLax more than once per day. Call your doctor if you are still constipated or irregular after using this medication for 7 days in a row.  If you continue to have problems with postoperative constipation, please contact the office for further assistance and recommendations.  If you experience "the worst abdominal pain ever" or develop nausea or vomiting, please contact the office immediatly for further recommendations for treatment.  ITCHING  If you experience itching with your medications, try taking only a single pain pill, or even half a pain pill at a time.  You can also use Benadryl over the counter for itching or also to help with sleep.   MEDICATIONS See your medication summary on the "After Visit Summary" that the nursing staff will review with you  prior to discharge.  You may have some home medications which will be placed on hold until you complete the course of blood thinner medication.  It is important for you to complete the blood thinner medication as prescribed by your surgeon.  Continue your approved medications as instructed at time of discharge.  PRECAUTIONS If you experience chest pain or shortness of breath - call 911 immediately for transfer to the hospital emergency department.  If you develop a fever greater that 101 F, purulent drainage from wound, increased redness or drainage from wound, foul odor from the wound/dressing, or calf pain - CONTACT YOUR SURGEON.                                                   FOLLOW-UP APPOINTMENTS Make sure you keep all of your appointments after your operation with your surgeon and caregivers. You should call the office at the above phone number and   make an appointment for approximately two weeks after the date of your surgery or on the date instructed by your surgeon outlined in the "After Visit Summary".  RANGE OF MOTION AND STRENGTHENING EXERCISES  These exercises are designed to help you keep full movement of your hip joint. Follow your caregiver's or physical therapist's instructions. Perform all exercises about fifteen times, three times per day or as directed. Exercise both hips, even if you have had only one joint replacement. These exercises can be done on a training (exercise) mat, on the floor, on a table or on a bed. Use whatever works the best and is most comfortable for you. Use music or television while you are exercising so that the exercises are a pleasant break in your day. This will make your life better with the exercises acting as a break in routine you can look forward to.  . Lying on your back, slowly slide your foot toward your buttocks, raising your knee up off the floor. Then slowly slide your foot back down until your leg is straight again.  . Lying on your back spread  your legs as far apart as you can without causing discomfort.  . Lying on your side, raise your upper leg and foot straight up from the floor as far as is comfortable. Slowly lower the leg and repeat.  . Lying on your back, tighten up the muscle in the front of your thigh (quadriceps muscles). You can do this by keeping your leg straight and trying to raise your heel off the floor. This helps strengthen the largest muscle supporting your knee.  . Lying on your back, tighten up the muscles of your buttocks both with the legs straight and with the knee bent at a comfortable angle while keeping your heel on the floor.   POST-OPERATIVE OPIOID TAPER INSTRUCTIONS: . It is important to wean off of your opioid medication as soon as possible. If you do not need pain medication after your surgery it is ok to stop day one. . Opioids include: o Codeine, Hydrocodone(Norco, Vicodin), Oxycodone(Percocet, oxycontin) and hydromorphone amongst others.  . Long term and even short term use of opiods can cause: o Increased pain response o Dependence o Constipation o Depression o Respiratory depression o And more.  . Withdrawal symptoms can include o Flu like symptoms o Nausea, vomiting o And more . Techniques to manage these symptoms o Hydrate well o Eat regular healthy meals o Stay active o Use relaxation techniques(deep breathing, meditating, yoga) . Do Not substitute Alcohol to help with tapering . If you have been on opioids for less than two weeks and do not have pain than it is ok to stop all together.  . Plan to wean off of opioids o This plan should start within one week post op of your joint replacement. o Maintain the same interval or time between taking each dose and first decrease the dose.  o Cut the total daily intake of opioids by one tablet each day o Next start to increase the time between doses. o The last dose that should be eliminated is the evening dose.     IF YOU ARE TRANSFERRED  TO A SKILLED REHAB FACILITY If the patient is transferred to a skilled rehab facility following release from the hospital, a list of the current medications will be sent to the facility for the patient to continue.  When discharged from the skilled rehab facility, please have the facility set up the patient's Home   Health Physical Therapy prior to being released. Also, the skilled facility will be responsible for providing the patient with their medications at time of release from the facility to include their pain medication, the muscle relaxants, and their blood thinner medication. If the patient is still at the rehab facility at time of the two week follow up appointment, the skilled rehab facility will also need to assist the patient in arranging follow up appointment in our office and any transportation needs.  MAKE SURE YOU:  . Understand these instructions.  . Get help right away if you are not doing well or get worse.    DENTAL ANTIBIOTICS:  In most cases prophylactic antibiotics for Dental procdeures after total joint surgery are not necessary.  Exceptions are as follows:  1. History of prior total joint infection  2. Severely immunocompromised (Organ Transplant, cancer chemotherapy, Rheumatoid biologic meds such as Humera)  3. Poorly controlled diabetes (A1C &gt; 8.0, blood glucose over 200)  If you have one of these conditions, contact your surgeon for an antibiotic prescription, prior to your dental procedure.    Pick up stool softner and laxative for home use following surgery while on pain medications. Do not submerge incision under water. Please use good hand washing techniques while changing dressing each day. May shower starting three days after surgery. Please use a clean towel to pat the incision dry following showers. Continue to use ice for pain and swelling after surgery. Do not use any lotions or creams on the incision until instructed by your surgeon.  

## 2021-02-26 NOTE — Anesthesia Procedure Notes (Signed)
Procedure Name: MAC Date/Time: 02/26/2021 2:06 PM Performed by: Niel Hummer, CRNA Pre-anesthesia Checklist: Patient identified, Emergency Drugs available, Suction available and Patient being monitored Oxygen Delivery Method: Simple face mask

## 2021-02-27 DIAGNOSIS — Z20822 Contact with and (suspected) exposure to covid-19: Secondary | ICD-10-CM | POA: Diagnosis present

## 2021-02-27 DIAGNOSIS — I10 Essential (primary) hypertension: Secondary | ICD-10-CM | POA: Diagnosis present

## 2021-02-27 DIAGNOSIS — M1612 Unilateral primary osteoarthritis, left hip: Secondary | ICD-10-CM | POA: Diagnosis present

## 2021-02-27 DIAGNOSIS — Z87891 Personal history of nicotine dependence: Secondary | ICD-10-CM | POA: Diagnosis not present

## 2021-02-27 DIAGNOSIS — M069 Rheumatoid arthritis, unspecified: Secondary | ICD-10-CM | POA: Diagnosis present

## 2021-02-27 DIAGNOSIS — Z79899 Other long term (current) drug therapy: Secondary | ICD-10-CM | POA: Diagnosis not present

## 2021-02-27 LAB — BASIC METABOLIC PANEL
Anion gap: 10 (ref 5–15)
BUN: 8 mg/dL (ref 8–23)
CO2: 24 mmol/L (ref 22–32)
Calcium: 8.2 mg/dL — ABNORMAL LOW (ref 8.9–10.3)
Chloride: 102 mmol/L (ref 98–111)
Creatinine, Ser: 0.67 mg/dL (ref 0.61–1.24)
GFR, Estimated: >60 mL/min (ref >60)
Glucose, Bld: 114 mg/dL — ABNORMAL HIGH (ref 70–99)
Potassium: 4 mmol/L (ref 3.5–5.1)
Sodium: 136 mmol/L (ref 135–145)

## 2021-02-27 LAB — CBC
HCT: 38.4 % — ABNORMAL LOW (ref 39.0–52.0)
Hemoglobin: 12.8 g/dL — ABNORMAL LOW (ref 13.0–17.0)
MCH: 34 pg (ref 26.0–34.0)
MCHC: 33.3 g/dL (ref 30.0–36.0)
MCV: 101.9 fL — ABNORMAL HIGH (ref 80.0–100.0)
Platelets: 184 10*3/uL (ref 150–400)
RBC: 3.77 MIL/uL — ABNORMAL LOW (ref 4.22–5.81)
RDW: 14 % (ref 11.5–15.5)
WBC: 11 10*3/uL — ABNORMAL HIGH (ref 4.0–10.5)
nRBC: 0 % (ref 0.0–0.2)

## 2021-02-27 NOTE — Evaluation (Signed)
Physical Therapy Evaluation Patient Details Name: Kenneth Roy MRN: [id number] DOB: May 29, 1927 Today's Date: 02/27/2021   History of Present Illness  Pt is a 87 year old male s/p left THA direct anterior approach with PMHx significant for HTN, RA, OA  Clinical Impression  Pt is s/p THA resulting in the deficits listed below (see PT Problem List).  Pt will benefit from skilled PT to increase their independence and safety with mobility to allow discharge to the venue listed below.  Pt reports decreased mobility prior to surgery and was using a scooter for a few weeks.  Pt reports he is typically home alone however son will assist upon d/c.       Follow Up Recommendations Home health PT;Supervision/Assistance - 24 hour    Equipment Recommendations  None recommended by PT    Recommendations for Other Services       Precautions / Restrictions Precautions Precautions: Fall Restrictions Weight Bearing Restrictions: No LLE Weight Bearing: Weight bearing as tolerated      Mobility  Bed Mobility Overal bed mobility: Needs Assistance Bed Mobility: Supine to Sit     Supine to sit: Min assist     General bed mobility comments: assist for Lt LE    Transfers Overall transfer level: Needs assistance Equipment used: Rolling walker (2 wheeled) Transfers: Sit to/from Stand Sit to Stand: Min assist         General transfer comment: verbal cues for UE and LE positioning, assist to rise and steady  Ambulation/Gait Ambulation/Gait assistance: Min assist Gait Distance (Feet): 15 Feet (x2) Assistive device: Rolling walker (2 wheeled) Gait Pattern/deviations: Step-to pattern;Decreased stance time - left;Antalgic     General Gait Details: verbal cues for sequence, RW positioning, step length, posture, pt required one seated rest break due to fatigue  Stairs            Wheelchair Mobility    Modified Rankin (Stroke Patients Only)       Balance Overall balance assessment:  Needs assistance         Standing balance support: Bilateral upper extremity supported Standing balance-Leahy Scale: Poor Standing balance comment: reliant on UE support                             Pertinent Vitals/Pain Pain Assessment: 0-10 Pain Score: 6  Pain Location: left hip with mobility (none at rest) Pain Descriptors / Indicators: Aching;Sharp;Sore Pain Intervention(s): Repositioned;Monitored during session    Home Living Family/patient expects to be discharged to:: Private residence Living Arrangements: Alone Available Help at Discharge: Family;Available 24 hours/day (son to assist upon d/c) Type of Home: House Home Access: Ramped entrance     Home Layout: Able to live on main level with bedroom/bathroom Home Equipment: Insurance underwriter - 2 wheels      Prior Function Level of Independence: Independent with assistive device(s)         Comments: was using scooter mostly for mobility prior to surgery     Hand Dominance        Extremity/Trunk Assessment        Lower Extremity Assessment Lower Extremity Assessment: Generalized weakness;LLE deficits/detail LLE Deficits / Details: anticipated post op hip weakness, pt requiring assist to move LE, able to perform ankle pumps       Communication   Communication: No difficulties  Cognition Arousal/Alertness: Awake/alert Behavior During Therapy: WFL for tasks assessed/performed Overall Cognitive Status: Within Functional Limits for tasks assessed  General Comments      Exercises     Assessment/Plan    PT Assessment Patient needs continued PT services  PT Problem List Decreased strength;Decreased mobility;Decreased activity tolerance;Decreased balance;Decreased knowledge of use of DME;Pain       PT Treatment Interventions Gait training;Balance training;DME instruction;Therapeutic exercise;Functional mobility  training;Therapeutic activities;Patient/family education    PT Goals (Current goals can be found in the Care Plan section)  Acute Rehab PT Goals PT Goal Formulation: With patient Time For Goal Achievement: 03/06/21 Potential to Achieve Goals: Good    Frequency 7X/week   Barriers to discharge        Co-evaluation               AM-PAC PT "6 Clicks" Mobility  Outcome Measure Help needed turning from your back to your side while in a flat bed without using bedrails?: A Little Help needed moving from lying on your back to sitting on the side of a flat bed without using bedrails?: A Little Help needed moving to and from a bed to a chair (including a wheelchair)?: A Little Help needed standing up from a chair using your arms (e.g., wheelchair or bedside chair)?: A Little Help needed to walk in hospital room?: A Lot Help needed climbing 3-5 steps with a railing? : Total 6 Click Score: 15    End of Session Equipment Utilized During Treatment: Gait belt Activity Tolerance: Patient limited by fatigue Patient left: in chair;with call bell/phone within reach;with chair alarm set   PT Visit Diagnosis: Other abnormalities of gait and mobility (R26.89)    Time: 3532-9924 PT Time Calculation (min) (ACUTE ONLY): 18 min   Charges:   PT Evaluation $PT Eval Low Complexity: 1 Low        Kati PT, DPT Acute Rehabilitation Services Pager: [phone] Office: [phone]  Sarajane Jews 02/27/2021, 12:37 PM

## 2021-02-27 NOTE — Progress Notes (Signed)
Physical Therapy Treatment Patient Details Name: Kenneth Roy MRN: [id number] DOB: 09-25-1927 Today's Date: 02/27/2021    History of Present Illness Pt is a 87 year old male s/p left THA direct anterior approach with PMHx significant for HTN, RA, OA    PT Comments    Pt assisted with ambulating short distance in hallway.  Pt requires seated rest break and occasional min assist during ambulation for stabilizing.  Pt also required at least mod assist upon turning and backing up to recliner to avoid fall.  Pt does not appear safe to d/c home today.  RN notified.     Follow Up Recommendations  Home health PT;Supervision/Assistance - 24 hour     Equipment Recommendations  None recommended by PT    Recommendations for Other Services       Precautions / Restrictions Precautions Precautions: Fall Restrictions Weight Bearing Restrictions: No LLE Weight Bearing: Weight bearing as tolerated    Mobility  Bed Mobility Overal bed mobility: Needs Assistance Bed Mobility: Supine to Sit     Supine to sit: Min assist     General bed mobility comments: pt in recliner    Transfers Overall transfer level: Needs assistance Equipment used: Rolling walker (2 wheeled) Transfers: Sit to/from Stand Sit to Stand: Min assist         General transfer comment: verbal cues for UE and LE positioning, assist to rise and steady  Ambulation/Gait Ambulation/Gait assistance: Min assist;Mod assist Gait Distance (Feet): 40 Feet (x2) Assistive device: Rolling walker (2 wheeled) Gait Pattern/deviations: Step-to pattern;Decreased stance time - left;Antalgic     General Gait Details: verbal cues for sequence, RW positioning, step length, posture, pt required one seated rest break due to fatigue; occasional assist for stability; mod assist upon turning and backing up to recliner to avoid fall   Stairs             Wheelchair Mobility    Modified Rankin (Stroke Patients Only)        Balance Overall balance assessment: Needs assistance         Standing balance support: Bilateral upper extremity supported Standing balance-Leahy Scale: Poor Standing balance comment: reliant on UE support                            Cognition Arousal/Alertness: Awake/alert Behavior During Therapy: WFL for tasks assessed/performed Overall Cognitive Status: Within Functional Limits for tasks assessed                                        Exercises Total Joint Exercises Ankle Circles/Pumps: AROM;Both;10 reps Long Arc Quad: AROM;10 reps;Left    General Comments        Pertinent Vitals/Pain Pain Assessment: 0-10 Pain Score: 4  Pain Location: left hip with mobility (none at rest) Pain Descriptors / Indicators: Aching;Sharp;Sore Pain Intervention(s): Repositioned;Monitored during session    Home Living                      Prior Function            PT Goals (current goals can now be found in the care plan section) Acute Rehab PT Goals PT Goal Formulation: With patient Time For Goal Achievement: 03/06/21 Potential to Achieve Goals: Good Progress towards PT goals: Progressing toward goals    Frequency    7X/week  PT Plan Current plan remains appropriate    Co-evaluation              AM-PAC PT "6 Clicks" Mobility   Outcome Measure  Help needed turning from your back to your side while in a flat bed without using bedrails?: A Little Help needed moving from lying on your back to sitting on the side of a flat bed without using bedrails?: A Little Help needed moving to and from a bed to a chair (including a wheelchair)?: A Little Help needed standing up from a chair using your arms (e.g., wheelchair or bedside chair)?: A Lot Help needed to walk in hospital room?: A Lot Help needed climbing 3-5 steps with a railing? : A Lot 6 Click Score: 15    End of Session Equipment Utilized During Treatment: Gait  belt Activity Tolerance: Patient limited by fatigue Patient left: in chair;with call bell/phone within reach;with chair alarm set Nurse Communication: Mobility status PT Visit Diagnosis: Other abnormalities of gait and mobility (R26.89)     Time: 1517-6160 PT Time Calculation (min) (ACUTE ONLY): 20 min  Charges:  $Gait Training: 8-22 mins                     Paulino Door, DPT Acute Rehabilitation Services Pager: [phone] Office: [phone]   Maida Sale E 02/27/2021, 3:54 PM

## 2021-02-27 NOTE — Progress Notes (Signed)
   Subjective: 1 Day Post-Op Procedure(s) (LRB): TOTAL HIP ARTHROPLASTY ANTERIOR APPROACH (Left) Patient reports pain as mild.   Patient seen in rounds by Dr. Lequita Halt. Patient is well, and has had no acute complaints or problems. No issues overnight, patient states his preoperative pain has already improved. Denies chest pain or SOB. Foley catheter to be removed this AM. We will begin therapy today.   Objective: Vital signs in last 24 hours: Temp:  [97.4 F (36.3 C)-99 F (37.2 C)] 98.9 F (37.2 C) (05/17 0500) Pulse Rate:  [58-88] 82 (05/17 0500) Resp:  [14-19] 16 (05/17 0500) BP: (99-170)/(53-82) 150/72 (05/17 0500) SpO2:  [97 %-100 %] 100 % (05/17 0500) Weight:  [66.7 kg] 66.7 kg (05/16 1145)  Intake/Output from previous day:  Intake/Output Summary (Last 24 hours) at 02/27/2021 0737 Last data filed at 02/27/2021 0600 Gross per 24 hour  Intake 2708.69 ml  Output 1170 ml  Net 1538.69 ml     Intake/Output this shift: No intake/output data recorded.  Labs: Recent Labs    02/26/21 1315 02/27/21 0320  HGB 14.3 12.8*   Recent Labs    02/26/21 1315 02/27/21 0320  WBC 6.4 11.0*  RBC 4.25 3.77*  HCT 43.4 38.4*  PLT 174 184   Recent Labs    02/26/21 1200 02/27/21 0320  NA 138 136  K 3.9 4.0  CL 103 102  CO2 27 24  BUN 12 8  CREATININE 0.67 0.67  GLUCOSE 109* 114*  CALCIUM 9.0 8.2*   Recent Labs    02/26/21 1200  INR 0.9    Exam: General - Patient is Alert and Oriented Extremity - Neurologically intact Neurovascular intact Sensation intact distally Dorsiflexion/Plantar flexion intact Dressing - dressing C/D/I Motor Function - intact, moving foot and toes well on exam.   Past Medical History:  Diagnosis Date  . Hypertension   . Osteoarthritis   . Rheumatoid arthritis (HCC)     Assessment/Plan: 1 Day Post-Op Procedure(s) (LRB): TOTAL HIP ARTHROPLASTY ANTERIOR APPROACH (Left) Principal Problem:   OA (osteoarthritis) of hip Active Problems:    Primary osteoarthritis of left hip  Estimated body mass index is 23.02 kg/m as calculated from the following:   Height as of this encounter: 5\' 7"  (1.702 m).   Weight as of this encounter: 66.7 kg. Advance diet Up with therapy  DVT Prophylaxis - Aspirin Weight bearing as tolerated. Begin therapy.  Plan is to go Home after hospital stay with HEP. Possible discharge later today if progresses with therapy and doing well. Most likely will stay until tomorrow due to age.   , PA-C Orthopedic Surgery [phone] 02/27/2021, 7:37 AM

## 2021-02-28 LAB — CBC
HCT: 37.6 % — ABNORMAL LOW (ref 39.0–52.0)
Hemoglobin: 12.2 g/dL — ABNORMAL LOW (ref 13.0–17.0)
MCH: 33.6 pg (ref 26.0–34.0)
MCHC: 32.4 g/dL (ref 30.0–36.0)
MCV: 103.6 fL — ABNORMAL HIGH (ref 80.0–100.0)
Platelets: 154 10*3/uL (ref 150–400)
RBC: 3.63 MIL/uL — ABNORMAL LOW (ref 4.22–5.81)
RDW: 14.3 % (ref 11.5–15.5)
WBC: 8.3 10*3/uL (ref 4.0–10.5)
nRBC: 0 % (ref 0.0–0.2)

## 2021-02-28 LAB — BASIC METABOLIC PANEL
Anion gap: 6 (ref 5–15)
BUN: 12 mg/dL (ref 8–23)
CO2: 26 mmol/L (ref 22–32)
Calcium: 8.2 mg/dL — ABNORMAL LOW (ref 8.9–10.3)
Chloride: 103 mmol/L (ref 98–111)
Creatinine, Ser: 0.64 mg/dL (ref 0.61–1.24)
GFR, Estimated: >60 mL/min (ref >60)
Glucose, Bld: 106 mg/dL — ABNORMAL HIGH (ref 70–99)
Potassium: 3.9 mmol/L (ref 3.5–5.1)
Sodium: 135 mmol/L (ref 135–145)

## 2021-02-28 MED ORDER — HYDROCODONE-ACETAMINOPHEN 5-325 MG PO TABS: 1.0000 | ORAL_TABLET | Freq: Four times a day (QID) | ORAL | 0 refills | Status: DC | PRN

## 2021-02-28 MED ORDER — METHOCARBAMOL 500 MG PO TABS: 500.0000 mg | ORAL_TABLET | Freq: Four times a day (QID) | ORAL | 0 refills | Status: DC | PRN

## 2021-02-28 MED ORDER — ASPIRIN 325 MG PO TBEC: 325.0000 mg | DELAYED_RELEASE_TABLET | Freq: Two times a day (BID) | ORAL | 0 refills | Status: AC

## 2021-02-28 MED ORDER — TRAMADOL HCL 50 MG PO TABS: 50.0000 mg | ORAL_TABLET | Freq: Four times a day (QID) | ORAL | 0 refills | Status: DC | PRN

## 2021-02-28 NOTE — Progress Notes (Signed)
Physical Therapy Treatment Patient Details Name: Kenneth Roy MRN: [id number] DOB: 04/09/1927 Today's Date: 02/28/2021    History of Present Illness Pt is a 87 year old male s/p left THA direct anterior approach with PMHx significant for HTN, RA, OA    PT Comments    Pt's daughter present for session and educated on assist and technique.  Pt still requires mod assist for stability/balance with turning and backing up to recliner.  Daughter reports uncertainty of family available to assist upon d/c because son works.  Pt will need assist for safety with mobility so may require SNF if 24/7 not available.    Follow Up Recommendations  Home health PT;Supervision/Assistance - 24 hour     Equipment Recommendations  None recommended by PT    Recommendations for Other Services       Precautions / Restrictions Precautions Precautions: Fall Restrictions LLE Weight Bearing: Weight bearing as tolerated    Mobility  Bed Mobility Overal bed mobility: Needs Assistance Bed Mobility: Supine to Sit     Supine to sit: Min guard;HOB elevated          Transfers Overall transfer level: Needs assistance Equipment used: Rolling walker (2 wheeled) Transfers: Sit to/from Stand Sit to Stand: Min guard         General transfer comment: verbal cues for UE and LE positioning  Ambulation/Gait Ambulation/Gait assistance: Mod assist Gait Distance (Feet): 60 Feet (x2) Assistive device: Rolling walker (2 wheeled) Gait Pattern/deviations: Step-to pattern;Decreased stance time - left;Antalgic     General Gait Details: verbal cues for sequence, RW positioning, step length, posture, pt required one seated rest break due to fatigue; pt mostly min/guard however again requiring assist to turn and back up to recliner   Stairs             Wheelchair Mobility    Modified Rankin (Stroke Patients Only)       Balance                                             Cognition Arousal/Alertness: Awake/alert Behavior During Therapy: WFL for tasks assessed/performed Overall Cognitive Status: Within Functional Limits for tasks assessed                                        Exercises      General Comments        Pertinent Vitals/Pain Pain Assessment: 0-10 Pain Score: 2  Pain Location: left hip with mobility (none at rest) Pain Descriptors / Indicators: Aching;Sore Pain Intervention(s): Repositioned;Monitored during session    Home Living                      Prior Function            PT Goals (current goals can now be found in the care plan section) Progress towards PT goals: Progressing toward goals    Frequency    7X/week      PT Plan Current plan remains appropriate    Co-evaluation              AM-PAC PT "6 Clicks" Mobility   Outcome Measure  Help needed turning from your back to your side while in a flat bed without using bedrails?: A Little  Help needed moving from lying on your back to sitting on the side of a flat bed without using bedrails?: A Little Help needed moving to and from a bed to a chair (including a wheelchair)?: A Little Help needed standing up from a chair using your arms (e.g., wheelchair or bedside chair)?: A Little Help needed to walk in hospital room?: A Lot Help needed climbing 3-5 steps with a railing? : A Lot 6 Click Score: 16    End of Session Equipment Utilized During Treatment: Gait belt Activity Tolerance: Patient tolerated treatment well Patient left: in chair;with call bell/phone within reach;with chair alarm set;with family/visitor present Nurse Communication: Mobility status PT Visit Diagnosis: Other abnormalities of gait and mobility (R26.89)     Time: 0600-4599 PT Time Calculation (min) (ACUTE ONLY): 26 min  Charges:  $Gait Training: 23-37 mins                     Thomasene Mohair PT, DPT Acute Rehabilitation Services Pager: [phone] Office:  [phone]   Maida Sale E 02/28/2021, 3:00 PM

## 2021-02-28 NOTE — Progress Notes (Signed)
   Subjective: 2 Days Post-Op Procedure(s) (LRB): TOTAL HIP ARTHROPLASTY ANTERIOR APPROACH (Left) Patient reports pain as mild.   Patient seen in rounds by Dr. Lequita Halt. Patient is well, and has had no acute complaints or problems. Pt states he is ready to go home. Denies chest pain or SOB. No issues overnight.  Plan is to go Home after hospital stay.  Objective: Vital signs in last 24 hours: Temp:  [97.5 F (36.4 C)-99 F (37.2 C)] 97.5 F (36.4 C) (05/18 0503) Pulse Rate:  [71-95] 71 (05/18 0503) Resp:  [16-18] 16 (05/18 0503) BP: (117-138)/(66-74) 138/66 (05/18 0503) SpO2:  [96 %-99 %] 99 % (05/18 0503)  Intake/Output from previous day:  Intake/Output Summary (Last 24 hours) at 02/28/2021 0724 Last data filed at 02/28/2021 0600 Gross per 24 hour  Intake 858.04 ml  Output 1210 ml  Net -351.96 ml    Intake/Output this shift: No intake/output data recorded.  Labs: Recent Labs    02/26/21 1315 02/27/21 0320 02/28/21 0311  HGB 14.3 12.8* 12.2*   Recent Labs    02/27/21 0320 02/28/21 0311  WBC 11.0* 8.3  RBC 3.77* 3.63*  HCT 38.4* 37.6*  PLT 184 154   Recent Labs    02/27/21 0320 02/28/21 0311  NA 136 135  K 4.0 3.9  CL 102 103  CO2 24 26  BUN 8 12  CREATININE 0.67 0.64  GLUCOSE 114* 106*  CALCIUM 8.2* 8.2*   Recent Labs    02/26/21 1200  INR 0.9    Exam: General - Patient is Alert and Oriented Extremity - Neurologically intact Neurovascular intact Sensation intact distally Dorsiflexion/Plantar flexion intact Dressing/Incision - clean, dry, no drainage Motor Function - intact, moving foot and toes well on exam.   Past Medical History:  Diagnosis Date  . Hypertension   . Osteoarthritis   . Rheumatoid arthritis (HCC)     Assessment/Plan: 2 Days Post-Op Procedure(s) (LRB): TOTAL HIP ARTHROPLASTY ANTERIOR APPROACH (Left) Principal Problem:   OA (osteoarthritis) of hip Active Problems:   Primary osteoarthritis of left hip  Estimated  body mass index is 23.02 kg/m as calculated from the following:   Height as of this encounter: 5\' 7"  (1.702 m).   Weight as of this encounter: 66.7 kg. Up with therapy D/C IV fluids  DVT Prophylaxis - Aspirin Weight-bearing as tolerated  Plan for discharge after two sessions of physical therapy if meeting goals with HEP. Follow-up in the office in 2 weeks  The PDMP database was reviewed today prior to any opioid medications being prescribed to this patient.  , PA-C Orthopedic Surgery [phone] 02/28/2021, 7:24 AM

## 2021-02-28 NOTE — TOC Initial Note (Signed)
Transition of Care [facility]) - Initial/Assessment Note    Patient Details  Name: Kenneth Roy MRN: [id number] Date of Birth: [birth year]  [facility] CM/SW Contact:    Lennart Pall, LCSW Phone Number: 02/28/2021, 2:33 PM  Clinical Narrative:                 Met with pt and daughter today to discuss dc planning needs.  Pt is covered under the [employer] program via MD office and, also, has an MD office- based RN CM following plans Leonides Grills @ [phone]). Per PT reports, pt is requiring min  assistance and recommendations are that he have someone in the home with him to assist at dc.  Daughter at bedside and reports that she is set to return home ([place]) on Friday and notes that pt's son (only local family member) works and can only provide intermittent assistance.  Daughter asking about options for support and we discussed privately hiring a caregiver vs. SNF for rehab (if MD agrees).  Have alerted Leonides Grills at [facility] of pt/daughter concerns and she has alerted PA who will discuss further with pt and daughter in the morning.  TOC will follow for any dc needs once PA has discuss further with pt/family.  Expected Discharge Plan: Home/Self Care Barriers to Discharge: Continued Medical Work up   Patient Goals and CMS Choice Patient states their goals for this hospitalization and ongoing recovery are:: return home      Expected Discharge Plan and Services Expected Discharge Plan: Home/Self Care In-house Referral: Clinical Social Work     Living arrangements for the past 2 months: Single Family Home Expected Discharge Date: 02/28/21               DME Arranged: N/A DME Agency: NA       HH Arranged: NA [place] Agency: NA        Prior Living Arrangements/Services Living arrangements for the past 2 months: Single Family Home Lives with:: Self Patient language and need for interpreter reviewed:: Yes Do you feel safe going back to the place where you live?: Yes       Need for Family Participation in Patient Care: Yes (Comment) Care giver support system in place?: No (comment)   Criminal Activity/Legal Involvement Pertinent to Current Situation/Hospitalization: No - Comment as needed  Activities of Daily Living Home Assistive Devices/Equipment: Contact lenses,Hearing aid,Blood pressure cuff,Cane (specify quad or straight),Walker (specify type),Grab bars in shower,Hand-held shower hose,Dentures (specify type) ADL Screening (condition at time of admission) Patient's cognitive ability adequate to safely complete daily activities?: Yes Is the patient deaf or have difficulty hearing?: Yes Does the patient have difficulty seeing, even when wearing glasses/contacts?: No Does the patient have difficulty concentrating, remembering, or making decisions?: No Patient able to express need for assistance with ADLs?: Yes Does the patient have difficulty dressing or bathing?: No Independently performs ADLs?: Yes (appropriate for developmental age) Does the patient have difficulty walking or climbing stairs?: Yes Weakness of Legs: None Weakness of Arms/Hands: None  Permission Sought/Granted Permission sought to share information with : Family Supports Permission granted to share information with : Yes, Verbal Permission Granted  Share Information with NAME: Mehar Kirkwood     Permission granted to share info w Relationship: son  Permission granted to share info w Contact Information: [phone]  Emotional Assessment Appearance:: Appears stated age Attitude/Demeanor/Rapport: Engaged,Gracious Affect (typically observed): Accepting Orientation: : Oriented to Self,Oriented to Place,Oriented to  Time,Oriented to Situation Alcohol /  Substance Use: Not Applicable Psych Involvement: No (comment)  Admission diagnosis:  Primary osteoarthritis of left hip [M16.12] Patient Active Problem List   Diagnosis Date Noted  . OA (osteoarthritis) of hip 02/26/2021  . Primary  osteoarthritis of left hip 02/26/2021   PCP:  Haywood Pao, MD Pharmacy:   [facility] [place] [place] [address] AT [address] [place] [address] Phone: [phone] Fax: [phone]     Social Determinants of Health (SDOH) Interventions    Readmission Risk Interventions Readmission Risk Prevention Plan 02/28/2021  Post Dischage Appt Complete  Medication Screening Complete  Transportation Screening Complete

## 2021-02-28 NOTE — Progress Notes (Signed)
Physical Therapy Treatment Patient Details Name: Kenneth Roy MRN: [id number] DOB: [birth year] Today's Date: 02/28/2021    History of Present Illness Pt is a 87 year old male s/p left THA direct anterior approach with PMHx significant for HTN, RA, OA    PT Comments    Pt assisted with ambulating in hallway and continues to require at least min assist for challenges such as turning and walking backwards.  Pt assisted with LE exercises.     Follow Up Recommendations  Home health PT;Supervision/Assistance - 24 hour     Equipment Recommendations  None recommended by PT    Recommendations for Other Services       Precautions / Restrictions Precautions Precautions: Fall Restrictions LLE Weight Bearing: Weight bearing as tolerated    Mobility  Bed Mobility Overal bed mobility: Needs Assistance Bed Mobility: Supine to Sit     Supine to sit: Min guard;HOB elevated     General bed mobility comments: pt in recliner    Transfers Overall transfer level: Needs assistance Equipment used: Rolling walker (2 wheeled) Transfers: Sit to/from Stand Sit to Stand: Min guard         General transfer comment: verbal cues for UE and LE positioning  Ambulation/Gait Ambulation/Gait assistance: Min assist Gait Distance (Feet): 80 Feet (x2) Assistive device: Rolling walker (2 wheeled) Gait Pattern/deviations: Step-to pattern;Decreased stance time - left;Antalgic     General Gait Details: verbal cues for sequence, RW positioning, step length, posture, pt required one standing rest break due to fatigue; pt mostly min/guard however again requiring min assist to turn and back up to recliner   Stairs             Wheelchair Mobility    Modified Rankin (Stroke Patients Only)       Balance                                            Cognition Arousal/Alertness: Awake/alert Behavior During Therapy: WFL for tasks assessed/performed Overall Cognitive Status:  Within Functional Limits for tasks assessed                                        Exercises Total Joint Exercises Ankle Circles/Pumps: AROM;Both;10 reps Quad Sets: AROM;Left;10 reps Short Arc QuadBarbaraann Boys;Left;10 reps Heel Slides: AAROM;Left;10 reps Hip ABduction/ADduction: AAROM;Left;10 reps    General Comments        Pertinent Vitals/Pain Pain Assessment: 0-10 Pain Score: 4  Pain Location: left hip Pain Descriptors / Indicators: Aching;Sore;Burning Pain Intervention(s): Monitored during session;Repositioned    Home Living                      Prior Function            PT Goals (current goals can now be found in the care plan section) Progress towards PT goals: Progressing toward goals    Frequency    7X/week      PT Plan Current plan remains appropriate    Co-evaluation              AM-PAC PT "6 Clicks" Mobility   Outcome Measure  Help needed turning from your back to your side while in a flat bed without using bedrails?: A Little Help needed moving from lying  on your back to sitting on the side of a flat bed without using bedrails?: A Little Help needed moving to and from a bed to a chair (including a wheelchair)?: A Little Help needed standing up from a chair using your arms (e.g., wheelchair or bedside chair)?: A Little Help needed to walk in hospital room?: A Lot Help needed climbing 3-5 steps with a railing? : A Lot 6 Click Score: 16    End of Session Equipment Utilized During Treatment: Gait belt Activity Tolerance: Patient tolerated treatment well Patient left: in chair;with call bell/phone within reach;with chair alarm set Nurse Communication: Mobility status PT Visit Diagnosis: Other abnormalities of gait and mobility (R26.89)     Time: 9163-8466 PT Time Calculation (min) (ACUTE ONLY): 13 min  Charges:   $Therapeutic Exercise: 8-22 mins                     Thomasene Mohair PT, DPT Acute Rehabilitation  Services Pager: [phone] Office: [phone]  Maida Sale E 02/28/2021, 3:05 PM

## 2021-03-01 ENCOUNTER — Encounter (HOSPITAL_COMMUNITY): Payer: Self-pay | Admitting: Orthopedic Surgery

## 2021-03-01 LAB — CBC
HCT: 37.7 % — ABNORMAL LOW (ref 39.0–52.0)
Hemoglobin: 12.3 g/dL — ABNORMAL LOW (ref 13.0–17.0)
MCH: 33.3 pg (ref 26.0–34.0)
MCHC: 32.6 g/dL (ref 30.0–36.0)
MCV: 102.2 fL — ABNORMAL HIGH (ref 80.0–100.0)
Platelets: 165 10*3/uL (ref 150–400)
RBC: 3.69 MIL/uL — ABNORMAL LOW (ref 4.22–5.81)
RDW: 14.1 % (ref 11.5–15.5)
WBC: 6.6 10*3/uL (ref 4.0–10.5)
nRBC: 0 % (ref 0.0–0.2)

## 2021-03-01 MED ORDER — SULFASALAZINE 500 MG PO TABS
1000.0000 mg | ORAL_TABLET | Freq: Two times a day (BID) | ORAL | Status: DC
  Administered 2021-03-01 – 2021-03-02 (×3): 1000 mg via ORAL
  Filled 2021-03-01 (×3): qty 2

## 2021-03-01 MED ORDER — LISINOPRIL 20 MG PO TABS
20.0000 mg | ORAL_TABLET | Freq: Every day | ORAL | Status: DC
  Administered 2021-03-01 – 2021-03-02 (×2): 20 mg via ORAL
  Filled 2021-03-01 (×2): qty 1

## 2021-03-01 NOTE — Progress Notes (Signed)
   Subjective: 3 Days Post-Op Procedure(s) (LRB): TOTAL HIP ARTHROPLASTY ANTERIOR APPROACH (Left) Patient reports pain as mild.   Patient seen in rounds for Dr. Lequita Halt. Patient has had slower progression with physical therapy. Showed definite improvement yesterday in second session, ambulating 80'x2 with min assist. Blood pressure slightly elevated overnight, ok to restart home med lisinopril now that he's 48+ hours out from surgery. Denies chest pain or SOB. Reports pain as minimal. Plan is to go Home after hospital stay.  Objective: Vital signs in last 24 hours: Temp:  [97.9 F (36.6 C)-98 F (36.7 C)] 98 F (36.7 C) (05/19 0625) Pulse Rate:  [71-78] 75 (05/19 0625) Resp:  [16] 16 (05/19 0625) BP: (137-157)/(66-81) 157/81 (05/19 0625) SpO2:  [94 %-99 %] 99 % (05/19 0625)  Intake/Output from previous day:  Intake/Output Summary (Last 24 hours) at 03/01/2021 0743 Last data filed at 03/01/2021 0625 Gross per 24 hour  Intake 750 ml  Output 1125 ml  Net -375 ml    Intake/Output this shift: No intake/output data recorded.  Labs: Recent Labs    02/26/21 1315 02/27/21 0320 02/28/21 0311 03/01/21 0316  HGB 14.3 12.8* 12.2* 12.3*   Recent Labs    02/28/21 0311 03/01/21 0316  WBC 8.3 6.6  RBC 3.63* 3.69*  HCT 37.6* 37.7*  PLT 154 165   Recent Labs    02/27/21 0320 02/28/21 0311  NA 136 135  K 4.0 3.9  CL 102 103  CO2 24 26  BUN 8 12  CREATININE 0.67 0.64  GLUCOSE 114* 106*  CALCIUM 8.2* 8.2*   Recent Labs    02/26/21 1200  INR 0.9    Exam: General - Patient is Alert and Oriented Extremity - Neurologically intact Neurovascular intact Sensation intact distally Dorsiflexion/Plantar flexion intact Dressing/Incision - clean, dry, no drainage Motor Function - intact, moving foot and toes well on exam.   Past Medical History:  Diagnosis Date  . Hypertension   . Osteoarthritis   . Rheumatoid arthritis (HCC)     Assessment/Plan: 3 Days Post-Op  Procedure(s) (LRB): TOTAL HIP ARTHROPLASTY ANTERIOR APPROACH (Left) Principal Problem:   OA (osteoarthritis) of hip Active Problems:   Primary osteoarthritis of left hip  Estimated body mass index is 23.02 kg/m as calculated from the following:   Height as of this encounter: 5\' 7"  (1.702 m).   Weight as of this encounter: 66.7 kg. Up with therapy  DVT Prophylaxis - Aspirin Weight-bearing as tolerated  Plan for discharge to home once meeting goals with therapy. Patient's daughter is in the room today, who reports her sister may be able to come home for the weekend to stay with Mr. Noda. We will see how he progresses with therapy today, if she is able to fly in we will get him home today/tomorrow.  Terrence Dupont, PA-C Orthopedic Surgery [phone] 03/01/2021, 7:43 AM

## 2021-03-01 NOTE — Progress Notes (Signed)
Physical Therapy Treatment Patient Details Name: Kenneth Roy MRN: [id number] DOB: 02-11-1927 Today's Date: 03/01/2021    History of Present Illness Pt is a 87 year old male s/p left THA direct anterior approach with PMHx significant for HTN, RA, OA    PT Comments    Pt ambulated in hallway again and present with improved stability.  Pt also performed LE exercises and provided with HEP handout.  Daughter-in-law and pt anticipate d/c home tomorrow.    Follow Up Recommendations  Home health PT;Supervision/Assistance - 24 hour     Equipment Recommendations  None recommended by PT    Recommendations for Other Services       Precautions / Restrictions Precautions Precautions: Fall Restrictions LLE Weight Bearing: Weight bearing as tolerated    Mobility  Bed Mobility Overal bed mobility: Needs Assistance Bed Mobility: Sit to Supine       Sit to supine: Min guard   General bed mobility comments: cues for technique    Transfers Overall transfer level: Needs assistance Equipment used: Rolling walker (2 wheeled) Transfers: Sit to/from Stand Sit to Stand: Min guard         General transfer comment: verbal cues for UE and LE positioning  Ambulation/Gait Ambulation/Gait assistance: Min guard Gait Distance (Feet): 80 Feet (x2) Assistive device: Rolling walker (2 wheeled) Gait Pattern/deviations: Step-to pattern;Decreased stance time - left;Antalgic     General Gait Details: verbal cues for sequence, RW positioning, step length, posture, pt required one standing rest break due to fatigue; continues to show improved stability and safe technique   Stairs             Wheelchair Mobility    Modified Rankin (Stroke Patients Only)       Balance                                            Cognition Arousal/Alertness: Awake/alert Behavior During Therapy: WFL for tasks assessed/performed Overall Cognitive Status: Within Functional Limits for  tasks assessed                                        Exercises Total Joint Exercises Ankle Circles/Pumps: AROM;Both;10 reps Quad Sets: AROM;Left;10 reps Short Arc QuadBarbaraann Boys;Left;10 reps Heel Slides: AAROM;Left;10 reps Hip ABduction/ADduction: AAROM;Left;10 reps Long Arc Quad: AROM;10 reps;Left;Seated    General Comments        Pertinent Vitals/Pain Pain Assessment: 0-10 Pain Score: 3  Pain Location: left hip Pain Descriptors / Indicators: Aching;Sore;Burning Pain Intervention(s): Monitored during session;Repositioned    Home Living                      Prior Function            PT Goals (current goals can now be found in the care plan section) Progress towards PT goals: Progressing toward goals    Frequency    7X/week      PT Plan Current plan remains appropriate    Co-evaluation              AM-PAC PT "6 Clicks" Mobility   Outcome Measure  Help needed turning from your back to your side while in a flat bed without using bedrails?: A Little Help needed moving from lying on your back  to sitting on the side of a flat bed without using bedrails?: A Little Help needed moving to and from a bed to a chair (including a wheelchair)?: A Little Help needed standing up from a chair using your arms (e.g., wheelchair or bedside chair)?: A Little Help needed to walk in hospital room?: A Little Help needed climbing 3-5 steps with a railing? : A Lot 6 Click Score: 17    End of Session Equipment Utilized During Treatment: Gait belt Activity Tolerance: Patient tolerated treatment well Patient left: with call bell/phone within reach;with family/visitor present;in bed;with bed alarm set Nurse Communication: Mobility status PT Visit Diagnosis: Other abnormalities of gait and mobility (R26.89)     Time: 9373-4287 PT Time Calculation (min) (ACUTE ONLY): 22 min  Charges:  $Gait Training: 8-22 mins                      Paulino Door,  DPT Acute Rehabilitation Services Pager: [phone] Office: [phone]  Maida Sale E 03/01/2021, 3:56 PM

## 2021-03-01 NOTE — Progress Notes (Signed)
Physical Therapy Treatment Patient Details Name: Kenneth Roy MRN: [id number] DOB: 09/24/1927 Today's Date: 03/01/2021    History of Present Illness Pt is a 87 year old male s/p left THA direct anterior approach with PMHx significant for HTN, RA, OA    PT Comments    Pt with improved ambulation and mobility today.  Pt also performed LE exercises.  Pt's daughter in law into room end of visit and updated on pt's progress.   Follow Up Recommendations  Home health PT;Supervision/Assistance - 24 hour     Equipment Recommendations  None recommended by PT    Recommendations for Other Services       Precautions / Restrictions Precautions Precautions: Fall Restrictions LLE Weight Bearing: Weight bearing as tolerated    Mobility  Bed Mobility               General bed mobility comments: pt in recliner    Transfers Overall transfer level: Needs assistance Equipment used: Rolling walker (2 wheeled) Transfers: Sit to/from Stand Sit to Stand: Min guard         General transfer comment: verbal cues for UE and LE positioning  Ambulation/Gait Ambulation/Gait assistance: Min guard Gait Distance (Feet): 80 Feet (x2) Assistive device: Rolling walker (2 wheeled) Gait Pattern/deviations: Step-to pattern;Decreased stance time - left;Antalgic     General Gait Details: verbal cues for sequence, RW positioning, step length, posture, pt required one standing rest break due to fatigue; improved stability today, remembering cues for going slow and safety with turning and stepping backwards   Stairs             Wheelchair Mobility    Modified Rankin (Stroke Patients Only)       Balance                                            Cognition Arousal/Alertness: Awake/alert Behavior During Therapy: WFL for tasks assessed/performed Overall Cognitive Status: Within Functional Limits for tasks assessed                                         Exercises Total Joint Exercises Ankle Circles/Pumps: AROM;Both;10 reps Quad Sets: AROM;Left;10 reps Short Arc QuadBarbaraann Boys;Left;10 reps Heel Slides: AAROM;Left;10 reps Hip ABduction/ADduction: AAROM;Left;10 reps Long Arc Quad: AROM;10 reps;Left;Seated    General Comments        Pertinent Vitals/Pain Pain Assessment: 0-10 Pain Score: 2  Pain Location: left hip Pain Descriptors / Indicators: Aching;Sore;Burning Pain Intervention(s): Monitored during session;Repositioned    Home Living                      Prior Function            PT Goals (current goals can now be found in the care plan section) Progress towards PT goals: Progressing toward goals    Frequency    7X/week      PT Plan Current plan remains appropriate    Co-evaluation              AM-PAC PT "6 Clicks" Mobility   Outcome Measure  Help needed turning from your back to your side while in a flat bed without using bedrails?: A Little Help needed moving from lying on your back to sitting on the  side of a flat bed without using bedrails?: A Little Help needed moving to and from a bed to a chair (including a wheelchair)?: A Little Help needed standing up from a chair using your arms (e.g., wheelchair or bedside chair)?: A Little Help needed to walk in hospital room?: A Little Help needed climbing 3-5 steps with a railing? : A Lot 6 Click Score: 17    End of Session Equipment Utilized During Treatment: Gait belt Activity Tolerance: Patient tolerated treatment well Patient left: in chair;with call bell/phone within reach;with chair alarm set;with family/visitor present   PT Visit Diagnosis: Other abnormalities of gait and mobility (R26.89)     Time: 2979-8921 PT Time Calculation (min) (ACUTE ONLY): 18 min  Charges:  $Therapeutic Exercise: 8-22 mins                     Thomasene Mohair PT, DPT Acute Rehabilitation Services Pager: [phone] Office: [phone]  Maida Sale  E 03/01/2021, 1:03 PM

## 2021-03-01 NOTE — TOC Progression Note (Signed)
Transition of Care [facility]) - Progression Note    Patient Details  Name: Kenneth Roy MRN: [id number] Date of Birth: 1927-03-12  [facility] CM/SW Contact  Lennart Pall, LCSW Phone Number: 03/01/2021, 3:55 PM  Clinical Narrative:    Met with pt and daughter this afternoon and both agree that pt continues to progress and plan is now for pt to dc home.  Daughter has extended her stay in [place] and another daughter to arrive on Sunday to stay several more days.  Per daughter request, have also provided information on local private duty caregiver agencies they can contact if they feel this is needed when family no longer available.     Expected Discharge Plan: Home/Self Care Barriers to Discharge: Continued Medical Work up  Expected Discharge Plan and Services Expected Discharge Plan: Home/Self Care In-house Referral: Clinical Social Work     Living arrangements for the past 2 months: Single Family Home Expected Discharge Date: 02/28/21               DME Arranged: N/A DME Agency: NA       HH Arranged: NA HH Agency: NA         Social Determinants of Health (SDOH) Interventions    Readmission Risk Interventions Readmission Risk Prevention Plan 02/28/2021  Post Dischage Appt Complete  Medication Screening Complete  Transportation Screening Complete

## 2021-03-02 NOTE — Progress Notes (Signed)
Physical Therapy Treatment Patient Details Name: Kenneth Roy MRN: [id number] DOB: [birth year] Today's Date: 03/02/2021    History of Present Illness Pt is a 87 year old male s/p left THA direct anterior approach with PMHx significant for HTN, RA, OA    PT Comments    Pt ambulated in hallway and did not require any physical assist today. Pt also educated and performed standing exercises.  Pt feels confident with understanding supine and sitting exercises on HEP.  Pt ready for d/c home today.   Follow Up Recommendations  Home health PT;Supervision/Assistance - 24 hour     Equipment Recommendations  None recommended by PT    Recommendations for Other Services       Precautions / Restrictions Precautions Precautions: Fall Restrictions LLE Weight Bearing: Weight bearing as tolerated    Mobility  Bed Mobility Overal bed mobility: Needs Assistance Bed Mobility: Supine to Sit     Supine to sit: Supervision          Transfers Overall transfer level: Needs assistance Equipment used: Rolling walker (2 wheeled) Transfers: Sit to/from Stand Sit to Stand: Min guard         General transfer comment: min/guard for safety; pt recalls correct technique  Ambulation/Gait Ambulation/Gait assistance: Min guard Gait Distance (Feet): 160 Feet Assistive device: Rolling walker (2 wheeled) Gait Pattern/deviations: Step-to pattern;Decreased stance time - left;Antalgic     General Gait Details: continues to show improved stability and safe technique   Stairs             Wheelchair Mobility    Modified Rankin (Stroke Patients Only)       Balance                                            Cognition Arousal/Alertness: Awake/alert Behavior During Therapy: WFL for tasks assessed/performed Overall Cognitive Status: Within Functional Limits for tasks assessed                                        Exercises Total Joint  Exercises Hip ABduction/ADduction: AROM;Left;10 reps;Standing (all standing exercises performed with UE support) Knee Flexion: AROM;Left;10 reps;Standing Marching in Standing: AROM;Left;10 reps;Standing Standing Hip Extension: AROM;Standing;10 reps;Left    General Comments        Pertinent Vitals/Pain Pain Assessment: 0-10 Pain Score: 2  Pain Location: left hip Pain Descriptors / Indicators: Aching;Sore;Burning Pain Intervention(s): Repositioned;Monitored during session    Home Living                      Prior Function            PT Goals (current goals can now be found in the care plan section) Progress towards PT goals: Progressing toward goals    Frequency    7X/week      PT Plan Current plan remains appropriate    Co-evaluation              AM-PAC PT "6 Clicks" Mobility   Outcome Measure  Help needed turning from your back to your side while in a flat bed without using bedrails?: A Little Help needed moving from lying on your back to sitting on the side of a flat bed without using bedrails?: A Little Help needed moving to  and from a bed to a chair (including a wheelchair)?: A Little Help needed standing up from a chair using your arms (Roy.g., wheelchair or bedside chair)?: A Little Help needed to walk in hospital room?: A Little Help needed climbing 3-5 steps with a railing? : A Lot 6 Click Score: 17    End of Session Equipment Utilized During Treatment: Gait belt Activity Tolerance: Patient tolerated treatment well Patient left: with call bell/phone within reach;in chair;with chair alarm set   PT Visit Diagnosis: Other abnormalities of gait and mobility (R26.89)     Time: 4081-4481 PT Time Calculation (min) (ACUTE ONLY): 16 min  Charges:  $Therapeutic Exercise: 8-22 mins                    Kenneth Roy PT, DPT Acute Rehabilitation Services Pager: [phone] Office: [phone]  Kenneth Roy 03/02/2021, 3:17 PM

## 2021-03-02 NOTE — Care Management Important Message (Signed)
Important Message  Patient Details IM Letter placed in Patients room Name: Kenneth Roy MRN: [id number] Date of Birth: May 23, 1927   Medicare Important Message Given:  Yes     Caren Macadam 03/02/2021, 10:44 AM

## 2021-03-02 NOTE — Plan of Care (Signed)
  Problem: Pain Management: Goal: Pain level will decrease Outcome: Progressing   Problem: Activity: Goal: Ability to avoid complications of mobility impairment will improve Outcome: Progressing   Problem: Pain Management: Goal: Pain level will decrease with appropriate interventions Outcome: Progressing

## 2021-03-02 NOTE — Progress Notes (Signed)
   Subjective: 4 Days Post-Op Procedure(s) (LRB): TOTAL HIP ARTHROPLASTY ANTERIOR APPROACH (Left) Patient reports pain as mild.   Patient seen in rounds for Dr. Lequita Halt. Patient is well, and has had no acute complaints or problems. Denies chest pain or SOB. No issues overnight.  Plan is to go Home after hospital stay.  Objective: Vital signs in last 24 hours: Temp:  [98 F (36.7 C)-98.4 F (36.9 C)] 98.4 F (36.9 C) (05/20 0530) Pulse Rate:  [76-98] 76 (05/20 0530) Resp:  [16] 16 (05/20 0530) BP: (93-168)/(59-82) 168/82 (05/20 0530) SpO2:  [97 %] 97 % (05/20 0530)  Intake/Output from previous day:  Intake/Output Summary (Last 24 hours) at 03/02/2021 0729 Last data filed at 03/02/2021 0600 Gross per 24 hour  Intake 490 ml  Output 1150 ml  Net -660 ml    Intake/Output this shift: No intake/output data recorded.  Labs: Recent Labs    02/28/21 0311 03/01/21 0316  HGB 12.2* 12.3*   Recent Labs    02/28/21 0311 03/01/21 0316  WBC 8.3 6.6  RBC 3.63* 3.69*  HCT 37.6* 37.7*  PLT 154 165   Recent Labs    02/28/21 0311  NA 135  K 3.9  CL 103  CO2 26  BUN 12  CREATININE 0.64  GLUCOSE 106*  CALCIUM 8.2*   No results for input(s): LABPT, INR in the last 72 hours.  Exam: General - Patient is Alert and Oriented Extremity - Neurologically intact Neurovascular intact Sensation intact distally Dorsiflexion/Plantar flexion intact Dressing/Incision - clean, dry, no drainage Motor Function - intact, moving foot and toes well on exam.   Past Medical History:  Diagnosis Date  . Hypertension   . Osteoarthritis   . Rheumatoid arthritis (HCC)     Assessment/Plan: 4 Days Post-Op Procedure(s) (LRB): TOTAL HIP ARTHROPLASTY ANTERIOR APPROACH (Left) Principal Problem:   OA (osteoarthritis) of hip Active Problems:   Primary osteoarthritis of left hip  Estimated body mass index is 23.02 kg/m as calculated from the following:   Height as of this encounter: 5\' 7"   (1.702 m).   Weight as of this encounter: 66.7 kg. Up with therapy  DVT Prophylaxis - Aspirin Weight-bearing as tolerated  Did well with PT yesterday. Daughter currently in town is now planning to stay until Sunday and another daughter will then be able to be with Mr. Soloway for a few days after that.  Plan for d/c after one session of PT around lunchtime. Follow-up in the office in 2 weeks.  Terrence Dupont, PA-C Orthopedic Surgery [phone] 03/02/2021, 7:29 AM

## 2021-03-05 NOTE — Discharge Summary (Signed)
Physician Discharge Summary   Patient ID: Kenneth Roy MRN: [id number] DOB/AGE: [birth year] 87 y.o.  Admit date: 02/26/2021 Discharge date: 03/02/2021  Primary Diagnosis: Osteoarthritis, left hip   Admission Diagnoses:  Past Medical History:  Diagnosis Date  . Hypertension   . Osteoarthritis   . Rheumatoid arthritis (HCC)    Discharge Diagnoses:   Principal Problem:   OA (osteoarthritis) of hip Active Problems:   Primary osteoarthritis of left hip  Estimated body mass index is 23.02 kg/m as calculated from the following:   Height as of this encounter:  (1.702 m).   Weight as of this encounter: 66.7 kg.  Procedure:  Procedure(s) (LRB): TOTAL HIP ARTHROPLASTY ANTERIOR APPROACH (Left)   Consults: None  HPI: Kenneth Roy is a 87 y.o. male who has advanced end-  stage arthritis of their Left  hip with progressively worsening pain and  dysfunction.The patient has failed nonoperative management and presents for total hip arthroplasty.   Laboratory Data: Admission on 02/26/2021, Discharged on 03/02/2021  Component Date Value Ref Range Status  . Sodium 02/26/2021 138  135 - 145 mmol/L Final  . Potassium 02/26/2021 3.9  3.5 - 5.1 mmol/L Final  . Chloride 02/26/2021 103  98 - 111 mmol/L Final  . CO2 02/26/2021 27  22 - 32 mmol/L Final  . Glucose, Bld 02/26/2021 109* 70 - 99 mg/dL Final   Glucose reference range applies only to samples taken after fasting for at least 8 hours.  . BUN 02/26/2021 12  8 - 23 mg/dL Final  . Creatinine, Ser 02/26/2021 0.67  0.61 - 1.24 mg/dL Final  . Calcium 14/78/2956 9.0  8.9 - 10.3 mg/dL Final  . Total Protein 02/26/2021 6.6  6.5 - 8.1 g/dL Final  . Albumin 21/30/8657 3.5  3.5 - 5.0 g/dL Final  . AST 84/69/6295 21  15 - 41 U/L Final  . ALT 02/26/2021 18  0 - 44 U/L Final  . Alkaline Phosphatase 02/26/2021 126  38 - 126 U/L Final  . Total Bilirubin 02/26/2021 0.7  0.3 - 1.2 mg/dL Final  . GFR, Estimated 02/26/2021 >60  >60 mL/min Final    Comment: (NOTE) Calculated using the CKD-EPI Creatinine Equation (2021)   . Anion gap 02/26/2021 8  5 - 15 Final   Performed at [facility], [address] [place], [place] [address]  . Prothrombin Time 02/26/2021 12.2  11.4 - 15.2 seconds Final  . INR 02/26/2021 0.9  0.8 - 1.2 Final   Comment: (NOTE) INR goal varies based on device and disease states. Performed at [facility], [address] [place], [place] [address]   . ABO/RH(D) 02/26/2021 B POS   Final  . Antibody Screen 02/26/2021 NEG   Final  . Sample Expiration 02/26/2021    Final                   Value:03/01/2021,2359 Performed at [facility], [address], [place], [place] [address]   . MRSA, PCR 02/26/2021 NEGATIVE  NEGATIVE Final  . Staphylococcus aureus 02/26/2021 NEGATIVE  NEGATIVE Final   Comment: (NOTE) The Xpert SA Assay (FDA approved for NASAL specimens in patients 28 years of age and older), is one component of a comprehensive surveillance program. It is not intended to diagnose infection nor to guide or monitor treatment. Performed at [facility], [address], [place], [place] [address]   . ABO/RH(D) 02/26/2021    Final  Value:B POS Performed at [facility], [address] [place], [place] [address]   . WBC 02/26/2021 6.4  4.0 - 10.5 K/uL Final  . RBC 02/26/2021 4.25  4.22 - 5.81 MIL/uL Final  . Hemoglobin 02/26/2021 14.3  13.0 - 17.0 g/dL Final  . HCT 33/54/5625 43.4  39.0 - 52.0 % Final  . MCV 02/26/2021 102.1* 80.0 - 100.0 fL Final  . MCH 02/26/2021 33.6  26.0 - 34.0 pg Final  . MCHC 02/26/2021 32.9  30.0 - 36.0 g/dL Final  . RDW 63/89/3734 14.3  11.5 - 15.5 % Final  . Platelets 02/26/2021 174  150 - 400 K/uL Final  . nRBC 02/26/2021 0.0  0.0 - 0.2 % Final   Performed at [facility], [address] [place], [place] [address]  . WBC 02/27/2021 11.0* 4.0 -  10.5 K/uL Final  . RBC 02/27/2021 3.77* 4.22 - 5.81 MIL/uL Final  . Hemoglobin 02/27/2021 12.8* 13.0 - 17.0 g/dL Final  . HCT 11/57/2620 38.4* 39.0 - 52.0 % Final  . MCV 02/27/2021 101.9* 80.0 - 100.0 fL Final  . MCH 02/27/2021 34.0  26.0 - 34.0 pg Final  . MCHC 02/27/2021 33.3  30.0 - 36.0 g/dL Final  . RDW 35/59/7416 14.0  11.5 - 15.5 % Final  . Platelets 02/27/2021 184  150 - 400 K/uL Final  . nRBC 02/27/2021 0.0  0.0 - 0.2 % Final   Performed at [facility], [address] [place], [place] [address]  . Sodium 02/27/2021 136  135 - 145 mmol/L Final  . Potassium 02/27/2021 4.0  3.5 - 5.1 mmol/L Final  . Chloride 02/27/2021 102  98 - 111 mmol/L Final  . CO2 02/27/2021 24  22 - 32 mmol/L Final  . Glucose, Bld 02/27/2021 114* 70 - 99 mg/dL Final   Glucose reference range applies only to samples taken after fasting for at least 8 hours.  . BUN 02/27/2021 8  8 - 23 mg/dL Final  . Creatinine, Ser 02/27/2021 0.67  0.61 - 1.24 mg/dL Final  . Calcium 64/68/0321 8.2* 8.9 - 10.3 mg/dL Final  . GFR, Estimated 02/27/2021 >60  >60 mL/min Final   Comment: (NOTE) Calculated using the CKD-EPI Creatinine Equation (2021)   . Anion gap 02/27/2021 10  5 - 15 Final   Performed at [facility], [address] [place], [place] [address]  . WBC 02/28/2021 8.3  4.0 - 10.5 K/uL Final  . RBC 02/28/2021 3.63* 4.22 - 5.81 MIL/uL Final  . Hemoglobin 02/28/2021 12.2* 13.0 - 17.0 g/dL Final  . HCT 50/12/7046 37.6* 39.0 - 52.0 % Final  . MCV 02/28/2021 103.6* 80.0 - 100.0 fL Final  . MCH 02/28/2021 33.6  26.0 - 34.0 pg Final  . MCHC 02/28/2021 32.4  30.0 - 36.0 g/dL Final  . RDW 88/91/6945 14.3  11.5 - 15.5 % Final  . Platelets 02/28/2021 154  150 - 400 K/uL Final  . nRBC 02/28/2021 0.0  0.0 - 0.2 % Final   Performed at [facility], [address] [place], [place] [address]  . Sodium 02/28/2021 135  135 - 145 mmol/L Final  . Potassium 02/28/2021 3.9   3.5 - 5.1 mmol/L Final  . Chloride 02/28/2021 103  98 - 111 mmol/L Final  . CO2 02/28/2021 26  22 - 32 mmol/L Final  . Glucose, Bld 02/28/2021 106* 70 - 99 mg/dL Final   Glucose reference range applies only to samples  taken after fasting for at least 8 hours.  . BUN 02/28/2021 12  8 - 23 mg/dL Final  . Creatinine, Ser 02/28/2021 0.64  0.61 - 1.24 mg/dL Final  . Calcium 91/47/8295 8.2* 8.9 - 10.3 mg/dL Final  . GFR, Estimated 02/28/2021 >60  >60 mL/min Final   Comment: (NOTE) Calculated using the CKD-EPI Creatinine Equation (2021)   . Anion gap 02/28/2021 6  5 - 15 Final   Performed at [facility], [address], [place], [place] [address]  . WBC 03/01/2021 6.6  4.0 - 10.5 K/uL Final  . RBC 03/01/2021 3.69* 4.22 - 5.81 MIL/uL Final  . Hemoglobin 03/01/2021 12.3* 13.0 - 17.0 g/dL Final  . HCT 86/57/8469 37.7* 39.0 - 52.0 % Final  . MCV 03/01/2021 102.2* 80.0 - 100.0 fL Final  . MCH 03/01/2021 33.3  26.0 - 34.0 pg Final  . MCHC 03/01/2021 32.6  30.0 - 36.0 g/dL Final  . RDW 62/95/2841 14.1  11.5 - 15.5 % Final  . Platelets 03/01/2021 165  150 - 400 K/uL Final  . nRBC 03/01/2021 0.0  0.0 - 0.2 % Final   Performed at [facility], [address] [place], [place] [address]  Hospital Outpatient Visit on 02/22/2021  Component Date Value Ref Range Status  . SARS Coronavirus 2 02/22/2021 NEGATIVE  NEGATIVE Final   Comment: (NOTE) SARS-CoV-2 target nucleic acids are NOT DETECTED.  The SARS-CoV-2 RNA is generally detectable in upper and lower respiratory specimens during the acute phase of infection. Negative results do not preclude SARS-CoV-2 infection, do not rule out co-infections with other pathogens, and should not be used as the sole basis for treatment or other patient management decisions. Negative results must be combined with clinical observations, patient history, and epidemiological information. The expected result is Negative.  Fact  Sheet for Patients: HairSlick.no  Fact Sheet for Healthcare Providers: quierodirigir.com  This test is not yet approved or cleared by the [place] FDA and  has been authorized for detection and/or diagnosis of SARS-CoV-2 by FDA under an Emergency Use Authorization (EUA). This EUA will remain  in effect (meaning this test can be used) for the duration of the COVID-19 declaration under Se                          ction 564(b)(1) of the Act, 21 U.S.C. section 360bbb-3(b)(1), unless the authorization is terminated or revoked sooner.  Performed at [facility], [address], [place], [place] [address]      X-Rays:DG Pelvis Portable  Result Date: 02/26/2021 CLINICAL DATA:  Status post left hip replacement EXAM: PORTABLE PELVIS 1 VIEWS COMPARISON:  None. FINDINGS: Frontal view of the pelvis reveals a pelvic ring to be intact. Left hip prosthesis is noted in satisfactory position. No acute bony or soft tissue abnormality is noted. IMPRESSION: Status post left hip replacement. Electronically Signed   By: Alcide Clever M.D.   On: 02/26/2021 18:59   DG C-Arm 1-60 Min-No Report  Result Date: 02/26/2021 Fluoroscopy was utilized by the requesting physician.  No radiographic interpretation.   DG HIP OPERATIVE UNILAT W OR W/O PELVIS LEFT  Result Date: 02/26/2021 CLINICAL DATA:  Left hip replacement EXAM: OPERATIVE LEFT HIP WITH PELVIS COMPARISON:  None. FLUOROSCOPY TIME:  Radiation Exposure Index (as provided by the fluoroscopic device): 0.61 mGy If the device does not provide the exposure index: Fluoroscopy Time:  6 seconds Number of Acquired Images:  2 FINDINGS: Left hip prosthesis is noted in satisfactory position. No acute soft tissue or bony abnormality is noted. IMPRESSION: Status post left hip replacement. Electronically Signed   By: Alcide Clever M.D.   On: 02/26/2021 19:00    EKG: Orders placed or performed during the hospital  encounter of 02/26/21  . EKG 12 lead per protocol  . EKG 12 lead per protocol     Hospital Course: DAVIT BRETL is a 87 y.o. who was admitted to [facility]. They were brought to the operating room on 02/26/2021 and underwent Procedure(s): TOTAL HIP ARTHROPLASTY ANTERIOR APPROACH.  Patient tolerated the procedure well and was later transferred to the recovery room and then to the orthopaedic floor for postoperative care. They were given PO and IV analgesics for pain control following their surgery. They were given 24 hours of postoperative antibiotics of  Anti-infectives (From admission, onward)   Start     Dose/Rate Route Frequency Ordered Stop   02/26/21 2000  ceFAZolin (ANCEF) IVPB 2g/100 mL premix        2 g 200 mL/hr over 30 Minutes Intravenous Every 6 hours 02/26/21 1745 02/27/21 0202   02/26/21 1145  ceFAZolin (ANCEF) IVPB 2g/100 mL premix        2 g 200 mL/hr over 30 Minutes Intravenous On call to O.R. 02/26/21 1140 02/26/21 1444     and started on DVT prophylaxis in the form of Aspirin.   PT and OT were ordered for total joint protocol. Discharge planning consulted to help with postop disposition and equipment needs. Patient had a good night on the evening of surgery. They started to get up OOB with therapy on POD #0. Continued to work with therapy into POD #2. Dressing was changed and the incision was clean, dry, and intact. On day three, patient was doing well and ready to go home. Between his two daughters, he would have at least 4 days of 24 hour monitoring/supervision. He was discharged later that day in stable condition.   Diet: Cardiac diet Activity: WBAT Follow-up: in 2 weeks Disposition: Home Discharged Condition: stable   Discharge Instructions    Call MD / Call 911   Complete by: As directed    If you experience chest pain or shortness of breath, CALL 911 and be transported to the hospital emergency room.  If you develope a fever above 101 F, pus (white  drainage) or increased drainage or redness at the wound, or calf pain, call your surgeon's office.   Change dressing   Complete by: As directed    You have an adhesive waterproof bandage over the incision. Leave this in place until your first follow-up appointment. Once you remove this you will not need to place another bandage.   Constipation Prevention   Complete by: As directed    Drink plenty of fluids.  Prune juice may be helpful.  You may use a stool softener, such as Colace (over the counter) 100 mg twice a day.  Use MiraLax (over the counter) for constipation as needed.   Diet - low sodium heart healthy   Complete by: As directed    Do not sit on low chairs, stoools or toilet seats, as it may be difficult to get up from low surfaces   Complete by: As directed    Driving restrictions   Complete by: As directed    No driving for two weeks   Post-operative opioid taper instructions:   Complete by: As directed  POST-OPERATIVE OPIOID TAPER INSTRUCTIONS: It is important to wean off of your opioid medication as soon as possible. If you do not need pain medication after your surgery it is ok to stop day one. Opioids include: Codeine, Hydrocodone(Norco, Vicodin), Oxycodone(Percocet, oxycontin) and hydromorphone amongst others.  Long term and even short term use of opiods can cause: Increased pain response Dependence Constipation Depression Respiratory depression And more.  Withdrawal symptoms can include Flu like symptoms Nausea, vomiting And more Techniques to manage these symptoms Hydrate well Eat regular healthy meals Stay active Use relaxation techniques(deep breathing, meditating, yoga) Do Not substitute Alcohol to help with tapering If you have been on opioids for less than two weeks and do not have pain than it is ok to stop all together.  Plan to wean off of opioids This plan should start within one week post op of your joint replacement. Maintain the same interval or  time between taking each dose and first decrease the dose.  Cut the total daily intake of opioids by one tablet each day Next start to increase the time between doses. The last dose that should be eliminated is the evening dose.      TED hose   Complete by: As directed    Use stockings (TED hose) for three weeks on both leg(s).  You may remove them at night for sleeping.   Weight bearing as tolerated   Complete by: As directed      Allergies as of 03/02/2021   No Known Allergies     Medication List    STOP taking these medications   naproxen sodium 220 MG tablet Commonly known as: ALEVE     TAKE these medications   aspirin 325 MG EC tablet Take 1 tablet (325 mg total) by mouth 2 (two) times daily for 19 days. Then take one 81 mg aspirin once a day for three weeks. Then discontinue aspirin.   CENTRUM SILVER PO Take 1 tablet by mouth daily.   cholecalciferol 25 MCG (1000 UNIT) tablet Commonly known as: VITAMIN D3 Take 1,000 Units by mouth daily.   HYDROcodone-acetaminophen 5-325 MG tablet Commonly known as: NORCO/VICODIN Take 1-2 tablets by mouth every 6 (six) hours as needed for severe pain.   lisinopril 20 MG tablet Commonly known as: ZESTRIL Take 20 mg by mouth daily.   methocarbamol 500 MG tablet Commonly known as: ROBAXIN Take 1 tablet (500 mg total) by mouth every 6 (six) hours as needed for muscle spasms.   sulfaSALAzine 500 MG tablet Commonly known as: AZULFIDINE Take 1,000 mg by mouth 2 (two) times daily.   traMADol 50 MG tablet Commonly known as: ULTRAM Take 1-2 tablets (50-100 mg total) by mouth every 6 (six) hours as needed for moderate pain. What changed:   when to take this  reasons to take this            Discharge Care Instructions  (From admission, onward)         Start     Ordered   02/28/21 0000  Weight bearing as tolerated        02/28/21 0731   02/28/21 0000  Change dressing       Comments: You have an adhesive waterproof  bandage over the incision. Leave this in place until your first follow-up appointment. Once you remove this you will not need to place another bandage.   02/28/21 0731          Follow-up Information    Ollen Gross, MD.  Schedule an appointment as soon as possible for a visit in 2 week(s).   Specialty: Orthopedic Surgery Contact information: [address] [place] [address] [place] [address] [phone]               Signed: Arther Abbott, PA-C Orthopedic Surgery 03/05/2021, 10:02 AM

## 2021-03-20 DIAGNOSIS — Z96642 Presence of left artificial hip joint: Secondary | ICD-10-CM | POA: Diagnosis not present

## 2021-03-20 DIAGNOSIS — Z9181 History of falling: Secondary | ICD-10-CM | POA: Diagnosis not present

## 2021-03-20 DIAGNOSIS — M06341 Rheumatoid nodule, right hand: Secondary | ICD-10-CM | POA: Diagnosis not present

## 2021-03-20 DIAGNOSIS — Z471 Aftercare following joint replacement surgery: Secondary | ICD-10-CM | POA: Diagnosis not present

## 2021-03-20 DIAGNOSIS — I10 Essential (primary) hypertension: Secondary | ICD-10-CM | POA: Diagnosis not present

## 2021-03-20 DIAGNOSIS — Z96659 Presence of unspecified artificial knee joint: Secondary | ICD-10-CM | POA: Diagnosis not present

## 2021-03-23 DIAGNOSIS — Z96642 Presence of left artificial hip joint: Secondary | ICD-10-CM | POA: Diagnosis not present

## 2021-03-23 DIAGNOSIS — Z471 Aftercare following joint replacement surgery: Secondary | ICD-10-CM | POA: Diagnosis not present

## 2021-03-23 DIAGNOSIS — Z9181 History of falling: Secondary | ICD-10-CM | POA: Diagnosis not present

## 2021-03-23 DIAGNOSIS — I10 Essential (primary) hypertension: Secondary | ICD-10-CM | POA: Diagnosis not present

## 2021-03-23 DIAGNOSIS — M06341 Rheumatoid nodule, right hand: Secondary | ICD-10-CM | POA: Diagnosis not present

## 2021-03-23 DIAGNOSIS — Z96659 Presence of unspecified artificial knee joint: Secondary | ICD-10-CM | POA: Diagnosis not present

## 2021-03-27 DIAGNOSIS — Z9181 History of falling: Secondary | ICD-10-CM | POA: Diagnosis not present

## 2021-03-27 DIAGNOSIS — M06341 Rheumatoid nodule, right hand: Secondary | ICD-10-CM | POA: Diagnosis not present

## 2021-03-27 DIAGNOSIS — I10 Essential (primary) hypertension: Secondary | ICD-10-CM | POA: Diagnosis not present

## 2021-03-27 DIAGNOSIS — Z96642 Presence of left artificial hip joint: Secondary | ICD-10-CM | POA: Diagnosis not present

## 2021-03-27 DIAGNOSIS — Z471 Aftercare following joint replacement surgery: Secondary | ICD-10-CM | POA: Diagnosis not present

## 2021-03-27 DIAGNOSIS — Z96659 Presence of unspecified artificial knee joint: Secondary | ICD-10-CM | POA: Diagnosis not present

## 2021-03-30 DIAGNOSIS — Z96659 Presence of unspecified artificial knee joint: Secondary | ICD-10-CM | POA: Diagnosis not present

## 2021-03-30 DIAGNOSIS — I10 Essential (primary) hypertension: Secondary | ICD-10-CM | POA: Diagnosis not present

## 2021-03-30 DIAGNOSIS — Z96642 Presence of left artificial hip joint: Secondary | ICD-10-CM | POA: Diagnosis not present

## 2021-03-30 DIAGNOSIS — Z471 Aftercare following joint replacement surgery: Secondary | ICD-10-CM | POA: Diagnosis not present

## 2021-03-30 DIAGNOSIS — M06341 Rheumatoid nodule, right hand: Secondary | ICD-10-CM | POA: Diagnosis not present

## 2021-03-30 DIAGNOSIS — Z9181 History of falling: Secondary | ICD-10-CM | POA: Diagnosis not present

## 2021-04-02 DIAGNOSIS — Z471 Aftercare following joint replacement surgery: Secondary | ICD-10-CM | POA: Diagnosis not present

## 2021-04-02 DIAGNOSIS — Z96642 Presence of left artificial hip joint: Secondary | ICD-10-CM | POA: Diagnosis not present

## 2021-04-02 DIAGNOSIS — I10 Essential (primary) hypertension: Secondary | ICD-10-CM | POA: Diagnosis not present

## 2021-04-02 DIAGNOSIS — Z9181 History of falling: Secondary | ICD-10-CM | POA: Diagnosis not present

## 2021-04-02 DIAGNOSIS — M06341 Rheumatoid nodule, right hand: Secondary | ICD-10-CM | POA: Diagnosis not present

## 2021-04-02 DIAGNOSIS — Z96659 Presence of unspecified artificial knee joint: Secondary | ICD-10-CM | POA: Diagnosis not present

## 2021-04-03 DIAGNOSIS — Z471 Aftercare following joint replacement surgery: Secondary | ICD-10-CM | POA: Diagnosis not present

## 2021-04-03 DIAGNOSIS — Z96642 Presence of left artificial hip joint: Secondary | ICD-10-CM | POA: Diagnosis not present

## 2021-04-17 DIAGNOSIS — M25511 Pain in right shoulder: Secondary | ICD-10-CM | POA: Diagnosis not present

## 2021-04-17 DIAGNOSIS — M0579 Rheumatoid arthritis with rheumatoid factor of multiple sites without organ or systems involvement: Secondary | ICD-10-CM | POA: Diagnosis not present

## 2021-04-17 DIAGNOSIS — Z6824 Body mass index (BMI) 24.0-24.9, adult: Secondary | ICD-10-CM | POA: Diagnosis not present

## 2021-04-17 DIAGNOSIS — R229 Localized swelling, mass and lump, unspecified: Secondary | ICD-10-CM | POA: Diagnosis not present

## 2021-04-17 DIAGNOSIS — M25531 Pain in right wrist: Secondary | ICD-10-CM | POA: Diagnosis not present

## 2021-04-17 DIAGNOSIS — G8929 Other chronic pain: Secondary | ICD-10-CM | POA: Diagnosis not present

## 2021-04-17 DIAGNOSIS — M2550 Pain in unspecified joint: Secondary | ICD-10-CM | POA: Diagnosis not present

## 2021-04-17 DIAGNOSIS — R5382 Chronic fatigue, unspecified: Secondary | ICD-10-CM | POA: Diagnosis not present

## 2021-04-30 DIAGNOSIS — M0579 Rheumatoid arthritis with rheumatoid factor of multiple sites without organ or systems involvement: Secondary | ICD-10-CM | POA: Diagnosis not present

## 2021-05-04 DIAGNOSIS — Z23 Encounter for immunization: Secondary | ICD-10-CM | POA: Diagnosis not present

## 2021-05-14 DIAGNOSIS — M65839 Other synovitis and tenosynovitis, unspecified forearm: Secondary | ICD-10-CM | POA: Diagnosis not present

## 2021-05-14 DIAGNOSIS — M06832 Other specified rheumatoid arthritis, left wrist: Secondary | ICD-10-CM | POA: Diagnosis not present

## 2021-05-14 DIAGNOSIS — M79641 Pain in right hand: Secondary | ICD-10-CM | POA: Diagnosis not present

## 2021-05-14 DIAGNOSIS — M06831 Other specified rheumatoid arthritis, right wrist: Secondary | ICD-10-CM | POA: Diagnosis not present

## 2021-05-28 DIAGNOSIS — M0579 Rheumatoid arthritis with rheumatoid factor of multiple sites without organ or systems involvement: Secondary | ICD-10-CM | POA: Diagnosis not present

## 2021-05-29 DIAGNOSIS — R351 Nocturia: Secondary | ICD-10-CM | POA: Diagnosis not present

## 2021-05-29 DIAGNOSIS — D692 Other nonthrombocytopenic purpura: Secondary | ICD-10-CM | POA: Diagnosis not present

## 2021-05-29 DIAGNOSIS — Z125 Encounter for screening for malignant neoplasm of prostate: Secondary | ICD-10-CM | POA: Diagnosis not present

## 2021-05-29 DIAGNOSIS — Z0000 Encounter for general adult medical examination without abnormal findings: Secondary | ICD-10-CM | POA: Diagnosis not present

## 2021-05-29 DIAGNOSIS — I10 Essential (primary) hypertension: Secondary | ICD-10-CM | POA: Diagnosis not present

## 2021-06-05 DIAGNOSIS — R82998 Other abnormal findings in urine: Secondary | ICD-10-CM | POA: Diagnosis not present

## 2021-06-05 DIAGNOSIS — D8989 Other specified disorders involving the immune mechanism, not elsewhere classified: Secondary | ICD-10-CM | POA: Diagnosis not present

## 2021-06-05 DIAGNOSIS — Z1331 Encounter for screening for depression: Secondary | ICD-10-CM | POA: Diagnosis not present

## 2021-06-05 DIAGNOSIS — R351 Nocturia: Secondary | ICD-10-CM | POA: Diagnosis not present

## 2021-06-05 DIAGNOSIS — M069 Rheumatoid arthritis, unspecified: Secondary | ICD-10-CM | POA: Diagnosis not present

## 2021-06-05 DIAGNOSIS — Z8669 Personal history of other diseases of the nervous system and sense organs: Secondary | ICD-10-CM | POA: Diagnosis not present

## 2021-06-05 DIAGNOSIS — H9193 Unspecified hearing loss, bilateral: Secondary | ICD-10-CM | POA: Diagnosis not present

## 2021-06-05 DIAGNOSIS — Z0000 Encounter for general adult medical examination without abnormal findings: Secondary | ICD-10-CM | POA: Diagnosis not present

## 2021-06-05 DIAGNOSIS — D692 Other nonthrombocytopenic purpura: Secondary | ICD-10-CM | POA: Diagnosis not present

## 2021-06-05 DIAGNOSIS — M1990 Unspecified osteoarthritis, unspecified site: Secondary | ICD-10-CM | POA: Diagnosis not present

## 2021-06-05 DIAGNOSIS — H7190 Unspecified cholesteatoma, unspecified ear: Secondary | ICD-10-CM | POA: Diagnosis not present

## 2021-06-05 DIAGNOSIS — I10 Essential (primary) hypertension: Secondary | ICD-10-CM | POA: Diagnosis not present

## 2021-06-05 DIAGNOSIS — Z1339 Encounter for screening examination for other mental health and behavioral disorders: Secondary | ICD-10-CM | POA: Diagnosis not present

## 2021-06-26 DIAGNOSIS — L820 Inflamed seborrheic keratosis: Secondary | ICD-10-CM | POA: Diagnosis not present

## 2021-06-26 DIAGNOSIS — L309 Dermatitis, unspecified: Secondary | ICD-10-CM | POA: Diagnosis not present

## 2021-07-13 DIAGNOSIS — Z23 Encounter for immunization: Secondary | ICD-10-CM | POA: Diagnosis not present

## 2021-07-13 DIAGNOSIS — L820 Inflamed seborrheic keratosis: Secondary | ICD-10-CM | POA: Diagnosis not present

## 2021-07-13 DIAGNOSIS — L570 Actinic keratosis: Secondary | ICD-10-CM | POA: Diagnosis not present

## 2021-07-13 DIAGNOSIS — L219 Seborrheic dermatitis, unspecified: Secondary | ICD-10-CM | POA: Diagnosis not present

## 2021-07-19 NOTE — Progress Notes (Signed)
Surgical Instructions    Your procedure is scheduled on 07/24/21.  Report to [facility] Main Entrance "A" at 12:50 P.M., then check in with the Admitting office.  Call this number if you have problems the morning of surgery:  [phone]   If you have any questions prior to your surgery date call [phone]: Open Monday-Friday 8am-4pm    Remember:  Do not eat after midnight the night before your surgery  You may drink clear liquids until 11:50am the morning of your surgery.   Clear liquids allowed are: Water, Non-Citrus Juices (without pulp), Carbonated Beverages, Clear Tea, Black Coffee ONLY (NO MILK, CREAM OR POWDERED CREAMER of any kind), and Gatorade  Patient Instructions  The night before surgery:  No food after midnight. ONLY clear liquids after midnight  The day of surgery (if you do NOT have diabetes):  Drink ONE (1) Pre-Surgery Clear Ensure by 11:50am the morning of surgery. Drink in one sitting. Do not sip.  This drink was given to you during your hospital  pre-op appointment visit. Nothing else to drink after completing the  Pre-Surgery Clear Ensure.          If you have questions, please contact your surgeon's office.     Take these medicines the morning of surgery with A SIP OF WATER: NONE    As of today, STOP taking any Aspirin (unless otherwise instructed by your surgeon) Aleve, Naproxen, Ibuprofen, Motrin, Advil, Goody's, BC's, all herbal medications, fish oil, sulfaSALAzine (AZULFIDINE) and all vitamins.     After your COVID test   You are not required to quarantine however you are required to wear a well-fitting mask when you are out and around people not in your household.  If your mask becomes wet or soiled, replace with a new one.  Wash your hands often with soap and water for 20 seconds or clean your hands with an alcohol-based hand sanitizer that contains at least 60% alcohol.  Do not share personal items.  Notify your provider: if you are  in close contact with someone who has COVID  or if you develop a fever of 100.4 or greater, sneezing, cough, sore throat, shortness of breath or body aches.             Do not wear jewelry or makeup Do not wear lotions, powders, perfumes/colognes, or deodorant. Men may shave face and neck. Do not bring valuables to the hospital. DO Not wear nail polish, gel polish, artificial nails, or any other type of covering on natural nails including finger and toenails. If patients have artificial nails, gel coating, etc. that need to be removed by a nail salon, please have this removed prior to surgery or surgery may need to be canceled/delayed if the surgeon/ anesthesia feels like the patient is unable to be adequately monitored.             [facility] is not responsible for any belongings or valuables.  Do NOT Smoke (Tobacco/Vaping)  24 hours prior to your procedure  If you use a CPAP at night, you may bring your mask for your overnight stay.   Contacts, glasses, hearing aids, dentures or partials may not be worn into surgery, please bring cases for these belongings   For patients admitted to the hospital, discharge time will be determined by your treatment team.   Patients discharged the day of surgery will not be allowed to drive home, and someone needs to stay with them for 24 hours.  NO  VISITORS WILL BE ALLOWED IN PRE-OP WHERE PATIENTS ARE PREPPED FOR SURGERY.  ONLY 1 SUPPORT PERSON MAY BE PRESENT IN THE WAITING ROOM WHILE YOU ARE IN SURGERY.  IF YOU ARE TO BE ADMITTED, ONCE YOU ARE IN YOUR ROOM YOU WILL BE ALLOWED TWO (2) VISITORS. 1 (ONE) VISITOR MAY STAY OVERNIGHT BUT MUST ARRIVE TO THE ROOM BY 8pm.  Minor children may have two parents present. Special consideration for safety and communication needs will be reviewed on a case by case basis.  Special instructions:    Oral Hygiene is also important to reduce your risk of infection.  Remember - BRUSH YOUR TEETH THE MORNING OF SURGERY WITH  YOUR REGULAR TOOTHPASTE   [place]- Preparing For Surgery  Before surgery, you can play an important role. Because skin is not sterile, your skin needs to be as free of germs as possible. You can reduce the number of germs on your skin by washing with CHG (chlorahexidine gluconate) Soap before surgery.  CHG is an antiseptic cleaner which kills germs and bonds with the skin to continue killing germs even after washing.     Please do not use if you have an allergy to CHG or antibacterial soaps. If your skin becomes reddened/irritated stop using the CHG.  Do not shave (including legs and underarms) for at least 48 hours prior to first CHG shower. It is OK to shave your face.  Please follow these instructions carefully.     Shower the NIGHT BEFORE SURGERY and the MORNING OF SURGERY with CHG Soap.   If you chose to wash your hair, wash your hair first as usual with your normal shampoo. After you shampoo, rinse your hair and body thoroughly to remove the shampoo.  Then [employer] and genitals (private parts) with your normal soap and rinse thoroughly to remove soap.  After that Use CHG Soap as you would any other liquid soap. You can apply CHG directly to the skin and wash gently with a scrungie or a clean washcloth.   Apply the CHG Soap to your body ONLY FROM THE NECK DOWN.  Do not use on open wounds or open sores. Avoid contact with your eyes, ears, mouth and genitals (private parts). Wash Face and genitals (private parts)  with your normal soap.   Wash thoroughly, paying special attention to the area where your surgery will be performed.  Thoroughly rinse your body with warm water from the neck down.  DO NOT shower/wash with your normal soap after using and rinsing off the CHG Soap.  Pat yourself dry with a CLEAN TOWEL.  Wear CLEAN PAJAMAS to bed the night before surgery  Place CLEAN SHEETS on your bed the night before your surgery  DO NOT SLEEP WITH PETS.   Day of Surgery: Take a  shower with CHG soap. Wear Clean/Comfortable clothing the morning of surgery Do not apply any deodorants/lotions.   Remember to brush your teeth WITH YOUR REGULAR TOOTHPASTE.   Please read over the following fact sheets that you were given.

## 2021-07-20 ENCOUNTER — Encounter (HOSPITAL_COMMUNITY)
Admission: RE | Admit: 2021-07-20 | Discharge: 2021-07-20 | Disposition: A | Discharge status: Home / Self Care | Payer: Medicare Other | Source: Ambulatory Visit | Attending: Orthopedic Surgery | Admitting: Orthopedic Surgery

## 2021-07-20 ENCOUNTER — Other Ambulatory Visit: Payer: Self-pay

## 2021-07-20 ENCOUNTER — Encounter (HOSPITAL_COMMUNITY): Payer: Self-pay

## 2021-07-20 DIAGNOSIS — Z01812 Encounter for preprocedural laboratory examination: Secondary | ICD-10-CM | POA: Diagnosis not present

## 2021-07-20 DIAGNOSIS — M2550 Pain in unspecified joint: Secondary | ICD-10-CM | POA: Diagnosis not present

## 2021-07-20 DIAGNOSIS — R229 Localized swelling, mass and lump, unspecified: Secondary | ICD-10-CM | POA: Diagnosis not present

## 2021-07-20 DIAGNOSIS — Z20822 Contact with and (suspected) exposure to covid-19: Secondary | ICD-10-CM | POA: Diagnosis not present

## 2021-07-20 DIAGNOSIS — G8929 Other chronic pain: Secondary | ICD-10-CM | POA: Diagnosis not present

## 2021-07-20 DIAGNOSIS — M25531 Pain in right wrist: Secondary | ICD-10-CM | POA: Diagnosis not present

## 2021-07-20 DIAGNOSIS — Z6824 Body mass index (BMI) 24.0-24.9, adult: Secondary | ICD-10-CM | POA: Diagnosis not present

## 2021-07-20 DIAGNOSIS — R5382 Chronic fatigue, unspecified: Secondary | ICD-10-CM | POA: Diagnosis not present

## 2021-07-20 DIAGNOSIS — M0579 Rheumatoid arthritis with rheumatoid factor of multiple sites without organ or systems involvement: Secondary | ICD-10-CM | POA: Diagnosis not present

## 2021-07-20 DIAGNOSIS — M25511 Pain in right shoulder: Secondary | ICD-10-CM | POA: Diagnosis not present

## 2021-07-20 HISTORY — DX: Gastro-esophageal reflux disease without esophagitis: K21.9

## 2021-07-20 LAB — BASIC METABOLIC PANEL
Anion gap: 6 (ref 5–15)
BUN: 12 mg/dL (ref 8–23)
CO2: 29 mmol/L (ref 22–32)
Calcium: 9.1 mg/dL (ref 8.9–10.3)
Chloride: 103 mmol/L (ref 98–111)
Creatinine, Ser: 0.75 mg/dL (ref 0.61–1.24)
GFR, Estimated: >60 mL/min (ref >60)
Glucose, Bld: 108 mg/dL — ABNORMAL HIGH (ref 70–99)
Potassium: 4.6 mmol/L (ref 3.5–5.1)
Sodium: 138 mmol/L (ref 135–145)

## 2021-07-20 LAB — CBC
HCT: 42.5 % (ref 39.0–52.0)
Hemoglobin: 14.8 g/dL (ref 13.0–17.0)
MCH: 37.7 pg — ABNORMAL HIGH (ref 26.0–34.0)
MCHC: 34.8 g/dL (ref 30.0–36.0)
MCV: 108.1 fL — ABNORMAL HIGH (ref 80.0–100.0)
Platelets: 159 10*3/uL (ref 150–400)
RBC: 3.93 MIL/uL — ABNORMAL LOW (ref 4.22–5.81)
RDW: 15.3 % (ref 11.5–15.5)
WBC: 5.7 10*3/uL (ref 4.0–10.5)
nRBC: 0 % (ref 0.0–0.2)

## 2021-07-20 LAB — SARS CORONAVIRUS 2 (TAT 6-24 HRS): SARS Coronavirus 2: NEGATIVE

## 2021-07-20 NOTE — Progress Notes (Signed)
PCP - Richard Tisovec Cardiologist - denies Rheumatologist: Azucena Fallen  PPM/ICD - denies   Chest x-ray - n/a EKG - 02/26/21 Stress Test - over 20 years ago, normal per patient ECHO - denies Cardiac Cath - denies  Sleep Study - denies   No diabetes  As of today, STOP taking any Aspirin (unless otherwise instructed by your surgeon) Aleve, Naproxen, Ibuprofen, Motrin, Advil, Goody's, BC's, all herbal medications, fish oil, sulfaSALAzine (AZULFIDINE) and all vitamins.  ERAS Protcol -yes PRE-SURGERY Ensure or G2- ensure given  COVID TEST- 07/20/21 in PAT   Anesthesia review: no  Patient denies shortness of breath, fever, cough and chest pain at PAT appointment   All instructions explained to the patient, with a verbal understanding of the material. Patient agrees to go over the instructions while at home for a better understanding. Patient also instructed to self quarantine after being tested for COVID-19. The opportunity to ask questions was provided.

## 2021-07-24 ENCOUNTER — Other Ambulatory Visit: Payer: Self-pay

## 2021-07-24 ENCOUNTER — Inpatient Hospital Stay (HOSPITAL_COMMUNITY)
Admission: AD | Admit: 2021-07-24 | Discharge: 2021-07-27 | DRG: 502 | Disposition: A | Discharge status: Skilled Nursing Facility | Payer: Medicare Other | Source: Ambulatory Visit | Attending: Orthopedic Surgery | Admitting: Orthopedic Surgery

## 2021-07-24 ENCOUNTER — Ambulatory Visit (HOSPITAL_COMMUNITY): Payer: Medicare Other | Admitting: Certified Registered"

## 2021-07-24 ENCOUNTER — Encounter (HOSPITAL_COMMUNITY)
Admission: AD | Disposition: A | Discharge status: Skilled Nursing Facility | Payer: Self-pay | Source: Ambulatory Visit | Attending: Orthopedic Surgery

## 2021-07-24 ENCOUNTER — Encounter (HOSPITAL_COMMUNITY): Payer: Self-pay | Admitting: Orthopedic Surgery

## 2021-07-24 DIAGNOSIS — M06831 Other specified rheumatoid arthritis, right wrist: Secondary | ICD-10-CM | POA: Diagnosis not present

## 2021-07-24 DIAGNOSIS — K219 Gastro-esophageal reflux disease without esophagitis: Secondary | ICD-10-CM | POA: Diagnosis present

## 2021-07-24 DIAGNOSIS — I10 Essential (primary) hypertension: Secondary | ICD-10-CM | POA: Diagnosis present

## 2021-07-24 DIAGNOSIS — Z20822 Contact with and (suspected) exposure to covid-19: Secondary | ICD-10-CM | POA: Diagnosis not present

## 2021-07-24 DIAGNOSIS — Z96651 Presence of right artificial knee joint: Secondary | ICD-10-CM | POA: Diagnosis present

## 2021-07-24 DIAGNOSIS — M65831 Other synovitis and tenosynovitis, right forearm: Secondary | ICD-10-CM | POA: Diagnosis present

## 2021-07-24 DIAGNOSIS — M069 Rheumatoid arthritis, unspecified: Secondary | ICD-10-CM | POA: Diagnosis not present

## 2021-07-24 DIAGNOSIS — M21941 Unspecified acquired deformity of hand, right hand: Secondary | ICD-10-CM | POA: Diagnosis not present

## 2021-07-24 DIAGNOSIS — Z96642 Presence of left artificial hip joint: Secondary | ICD-10-CM | POA: Diagnosis present

## 2021-07-24 DIAGNOSIS — Z87891 Personal history of nicotine dependence: Secondary | ICD-10-CM

## 2021-07-24 DIAGNOSIS — M65131 Other infective (teno)synovitis, right wrist: Secondary | ICD-10-CM | POA: Diagnosis not present

## 2021-07-24 DIAGNOSIS — Z79899 Other long term (current) drug therapy: Secondary | ICD-10-CM | POA: Diagnosis not present

## 2021-07-24 DIAGNOSIS — M169 Osteoarthritis of hip, unspecified: Secondary | ICD-10-CM | POA: Diagnosis not present

## 2021-07-24 DIAGNOSIS — M1612 Unilateral primary osteoarthritis, left hip: Secondary | ICD-10-CM | POA: Diagnosis not present

## 2021-07-24 HISTORY — PX: SYNOVECTOMY: SHX5180

## 2021-07-24 HISTORY — PX: TENDON TRANSFER: SHX6109

## 2021-07-24 HISTORY — PX: TENDON REPAIR: SHX5111

## 2021-07-24 SURGERY — TENDON REPAIR
Anesthesia: Monitor Anesthesia Care | Site: Wrist | Laterality: Right

## 2021-07-24 MED ORDER — LACTATED RINGERS IV SOLN: INTRAVENOUS | Status: DC

## 2021-07-24 MED ORDER — ONDANSETRON HCL 4 MG PO TABS: 4.0000 mg | ORAL_TABLET | Freq: Four times a day (QID) | ORAL | Status: DC | PRN

## 2021-07-24 MED ORDER — ACETAMINOPHEN 325 MG PO TABS
325.0000 mg | ORAL_TABLET | Freq: Four times a day (QID) | ORAL | Status: DC | PRN
  Administered 2021-07-24 – 2021-07-27 (×2): 650 mg via ORAL
  Filled 2021-07-24 (×2): qty 2

## 2021-07-24 MED ORDER — PROPOFOL 500 MG/50ML IV EMUL
INTRAVENOUS | Status: DC | PRN
  Administered 2021-07-24: 75 ug/kg/min via INTRAVENOUS
  Administered 2021-07-24: 100 ug/kg/min via INTRAVENOUS
  Administered 2021-07-24: 50 ug/kg/min via INTRAVENOUS

## 2021-07-24 MED ORDER — FENTANYL CITRATE (PF) 100 MCG/2ML IJ SOLN
INTRAMUSCULAR | Status: DC | PRN
  Administered 2021-07-24: 50 ug via INTRAVENOUS
  Administered 2021-07-24: 25 ug via INTRAVENOUS

## 2021-07-24 MED ORDER — SULFASALAZINE 500 MG PO TABS
1000.0000 mg | ORAL_TABLET | Freq: Two times a day (BID) | ORAL | Status: DC
  Administered 2021-07-24 – 2021-07-27 (×6): 1000 mg via ORAL
  Filled 2021-07-24 (×7): qty 2

## 2021-07-24 MED ORDER — ONDANSETRON HCL 4 MG/2ML IJ SOLN: 4.0000 mg | Freq: Once | INTRAMUSCULAR | Status: DC | PRN

## 2021-07-24 MED ORDER — VITAMIN D 25 MCG (1000 UNIT) PO TABS
1000.0000 [IU] | ORAL_TABLET | Freq: Every day | ORAL | Status: DC
  Administered 2021-07-25 – 2021-07-27 (×3): 1000 [IU] via ORAL
  Filled 2021-07-24 (×3): qty 1

## 2021-07-24 MED ORDER — CEFAZOLIN SODIUM-DEXTROSE 1-4 GM/50ML-% IV SOLN
1.0000 g | INTRAVENOUS | Status: AC
  Administered 2021-07-24: 1 g via INTRAVENOUS
  Filled 2021-07-24: qty 50

## 2021-07-24 MED ORDER — MIDAZOLAM HCL 2 MG/2ML IJ SOLN
INTRAMUSCULAR | Status: AC
  Filled 2021-07-24: qty 2

## 2021-07-24 MED ORDER — CEFAZOLIN SODIUM-DEXTROSE 2-4 GM/100ML-% IV SOLN
2.0000 g | INTRAVENOUS | Status: AC
  Filled 2021-07-24: qty 100

## 2021-07-24 MED ORDER — METHOCARBAMOL 1000 MG/10ML IJ SOLN: 500.0000 mg | Freq: Four times a day (QID) | INTRAVENOUS | Status: DC | PRN

## 2021-07-24 MED ORDER — MORPHINE SULFATE (PF) 2 MG/ML IV SOLN: 0.5000 mg | INTRAVENOUS | Status: DC | PRN

## 2021-07-24 MED ORDER — HYDROCODONE-ACETAMINOPHEN 5-325 MG PO TABS: 1.0000 | ORAL_TABLET | Freq: Four times a day (QID) | ORAL | Status: DC | PRN

## 2021-07-24 MED ORDER — ASCORBIC ACID 500 MG PO TABS
1000.0000 mg | ORAL_TABLET | Freq: Every day | ORAL | Status: DC
  Administered 2021-07-25 – 2021-07-27 (×3): 1000 mg via ORAL
  Filled 2021-07-24 (×3): qty 2

## 2021-07-24 MED ORDER — FENTANYL CITRATE (PF) 100 MCG/2ML IJ SOLN: 25.0000 ug | INTRAMUSCULAR | Status: DC | PRN

## 2021-07-24 MED ORDER — LISINOPRIL 20 MG PO TABS
20.0000 mg | ORAL_TABLET | Freq: Every day | ORAL | Status: DC
  Administered 2021-07-25 – 2021-07-27 (×3): 20 mg via ORAL
  Filled 2021-07-24 (×3): qty 1

## 2021-07-24 MED ORDER — CEFAZOLIN SODIUM-DEXTROSE 1-4 GM/50ML-% IV SOLN
1.0000 g | Freq: Three times a day (TID) | INTRAVENOUS | Status: DC
  Administered 2021-07-25 – 2021-07-27 (×8): 1 g via INTRAVENOUS
  Filled 2021-07-24 (×7): qty 50

## 2021-07-24 MED ORDER — FENTANYL CITRATE (PF) 250 MCG/5ML IJ SOLN
INTRAMUSCULAR | Status: AC
  Filled 2021-07-24: qty 5

## 2021-07-24 MED ORDER — INFLUENZA VAC A&B SA ADJ QUAD 0.5 ML IM PRSY
0.5000 mL | PREFILLED_SYRINGE | INTRAMUSCULAR | Status: DC
  Filled 2021-07-24: qty 0.5

## 2021-07-24 MED ORDER — SODIUM CHLORIDE 0.9 % IR SOLN
Status: DC | PRN
  Administered 2021-07-24: 1000 mL

## 2021-07-24 MED ORDER — ORAL CARE MOUTH RINSE
15.0000 mL | Freq: Once | OROMUCOSAL | Status: AC
Start: 2021-07-24 — End: 2021-07-24

## 2021-07-24 MED ORDER — CEFAZOLIN SODIUM-DEXTROSE 2-4 GM/100ML-% IV SOLN
2.0000 g | INTRAVENOUS | Status: AC
  Administered 2021-07-24: 2 g via INTRAVENOUS
  Filled 2021-07-24: qty 100

## 2021-07-24 MED ORDER — CHLORHEXIDINE GLUCONATE 0.12 % MT SOLN
15.0000 mL | Freq: Once | OROMUCOSAL | Status: AC
  Administered 2021-07-24: 15 mL via OROMUCOSAL
  Filled 2021-07-24: qty 15

## 2021-07-24 MED ORDER — BUPIVACAINE HCL (PF) 0.25 % IJ SOLN
INTRAMUSCULAR | Status: AC
  Filled 2021-07-24: qty 30

## 2021-07-24 MED ORDER — SODIUM CHLORIDE 0.9 % IV SOLN
INTRAVENOUS | Status: DC | PRN
  Administered 2021-07-24: 25 ug/min via INTRAVENOUS

## 2021-07-24 MED ORDER — DEXMEDETOMIDINE (PRECEDEX) IN NS 20 MCG/5ML (4 MCG/ML) IV SYRINGE
PREFILLED_SYRINGE | INTRAVENOUS | Status: DC | PRN
  Administered 2021-07-24: 8 ug via INTRAVENOUS

## 2021-07-24 MED ORDER — DOCUSATE SODIUM 100 MG PO CAPS
100.0000 mg | ORAL_CAPSULE | Freq: Two times a day (BID) | ORAL | Status: DC
  Administered 2021-07-26 – 2021-07-27 (×2): 100 mg via ORAL
  Filled 2021-07-24 (×6): qty 1

## 2021-07-24 MED ORDER — TRAMADOL HCL 50 MG PO TABS
50.0000 mg | ORAL_TABLET | Freq: Four times a day (QID) | ORAL | Status: DC
Start: 2021-07-24 — End: 2021-07-27
  Administered 2021-07-24 – 2021-07-27 (×11): 50 mg via ORAL
  Filled 2021-07-24 (×12): qty 1

## 2021-07-24 MED ORDER — HYDROCODONE-ACETAMINOPHEN 7.5-325 MG PO TABS: 1.0000 | ORAL_TABLET | ORAL | Status: DC | PRN

## 2021-07-24 MED ORDER — ORAL CARE MOUTH RINSE: 15.0000 mL | Freq: Once | OROMUCOSAL | Status: DC

## 2021-07-24 MED ORDER — METHOCARBAMOL 500 MG PO TABS: 500.0000 mg | ORAL_TABLET | Freq: Four times a day (QID) | ORAL | Status: DC | PRN

## 2021-07-24 MED ORDER — ACETAMINOPHEN 10 MG/ML IV SOLN: 1000.0000 mg | Freq: Once | INTRAVENOUS | Status: DC | PRN

## 2021-07-24 MED ORDER — FAMOTIDINE 20 MG PO TABS: 20.0000 mg | ORAL_TABLET | Freq: Two times a day (BID) | ORAL | Status: DC | PRN

## 2021-07-24 MED ORDER — FENTANYL CITRATE (PF) 100 MCG/2ML IJ SOLN
INTRAMUSCULAR | Status: AC
  Administered 2021-07-24: 50 ug via INTRAVENOUS
  Filled 2021-07-24: qty 2

## 2021-07-24 MED ORDER — LACTATED RINGERS IV SOLN: INTRAVENOUS | Status: DC | PRN

## 2021-07-24 MED ORDER — HYDROCODONE-ACETAMINOPHEN 5-325 MG PO TABS: 1.0000 | ORAL_TABLET | ORAL | Status: DC | PRN

## 2021-07-24 MED ORDER — FENTANYL CITRATE (PF) 100 MCG/2ML IJ SOLN: 50.0000 ug | Freq: Once | INTRAMUSCULAR | Status: AC

## 2021-07-24 MED ORDER — ONDANSETRON HCL 4 MG/2ML IJ SOLN: 4.0000 mg | Freq: Four times a day (QID) | INTRAMUSCULAR | Status: DC | PRN

## 2021-07-24 MED ORDER — PROPOFOL 10 MG/ML IV BOLUS
INTRAVENOUS | Status: DC | PRN
  Administered 2021-07-24 (×3): 20 mg via INTRAVENOUS

## 2021-07-24 MED ORDER — CHLORHEXIDINE GLUCONATE 0.12 % MT SOLN: 15.0000 mL | Freq: Once | OROMUCOSAL | Status: DC

## 2021-07-24 MED ORDER — GLYCOPYRROLATE PF 0.2 MG/ML IJ SOSY
PREFILLED_SYRINGE | INTRAMUSCULAR | Status: DC | PRN
  Administered 2021-07-24: .2 mg via INTRAVENOUS

## 2021-07-24 SURGICAL SUPPLY — 58 items
BAG COUNTER SPONGE SURGICOUNT (BAG) ×3 IMPLANT
BNDG COHESIVE 1X5 TAN STRL LF (GAUZE/BANDAGES/DRESSINGS) IMPLANT
BNDG CONFORM 2 STRL LF (GAUZE/BANDAGES/DRESSINGS) IMPLANT
BNDG ELASTIC 3X5.8 VLCR STR LF (GAUZE/BANDAGES/DRESSINGS) ×3 IMPLANT
BNDG ELASTIC 4X5.8 VLCR STR LF (GAUZE/BANDAGES/DRESSINGS) ×3 IMPLANT
BNDG GAUZE ELAST 4 BULKY (GAUZE/BANDAGES/DRESSINGS) ×3 IMPLANT
CORD BIPOLAR FORCEPS 12FT (ELECTRODE) ×3 IMPLANT
COVER SURGICAL LIGHT HANDLE (MISCELLANEOUS) ×3 IMPLANT
CUFF TOURN SGL QUICK 18X4 (TOURNIQUET CUFF) ×3 IMPLANT
CUFF TOURN SGL QUICK 24 (TOURNIQUET CUFF)
CUFF TRNQT CYL 24X4X16.5-23 (TOURNIQUET CUFF) IMPLANT
DECANTER SPIKE VIAL GLASS SM (MISCELLANEOUS) IMPLANT
DRAPE SURG 17X23 STRL (DRAPES) ×3 IMPLANT
DRSG ADAPTIC 3X8 NADH LF (GAUZE/BANDAGES/DRESSINGS) IMPLANT
DRSG MEPITEL 8X12 (GAUZE/BANDAGES/DRESSINGS) ×3 IMPLANT
GAUZE SPONGE 2X2 8PLY STRL LF (GAUZE/BANDAGES/DRESSINGS) IMPLANT
GAUZE SPONGE 4X4 12PLY STRL (GAUZE/BANDAGES/DRESSINGS) ×3 IMPLANT
GAUZE XEROFORM 1X8 LF (GAUZE/BANDAGES/DRESSINGS) IMPLANT
GAUZE XEROFORM 5X9 LF (GAUZE/BANDAGES/DRESSINGS) ×6 IMPLANT
GLOVE SURG ENC TEXT LTX SZ8 (GLOVE) ×3 IMPLANT
GLOVE SURG MICRO LTX SZ8 (GLOVE) ×3 IMPLANT
GOWN STRL REUS W/ TWL LRG LVL3 (GOWN DISPOSABLE) ×4 IMPLANT
GOWN STRL REUS W/ TWL XL LVL3 (GOWN DISPOSABLE) ×6 IMPLANT
GOWN STRL REUS W/TWL LRG LVL3 (GOWN DISPOSABLE) ×2
GOWN STRL REUS W/TWL XL LVL3 (GOWN DISPOSABLE) ×3
KIT BASIN OR (CUSTOM PROCEDURE TRAY) ×3 IMPLANT
KIT TURNOVER KIT B (KITS) ×3 IMPLANT
MANIFOLD NEPTUNE II (INSTRUMENTS) ×3 IMPLANT
NEEDLE HYPO 25GX1X1/2 BEV (NEEDLE) IMPLANT
NS IRRIG 1000ML POUR BTL (IV SOLUTION) IMPLANT
PACK ORTHO EXTREMITY (CUSTOM PROCEDURE TRAY) ×3 IMPLANT
PAD ARMBOARD 7.5X6 YLW CONV (MISCELLANEOUS) ×6 IMPLANT
PAD CAST 3X4 CTTN HI CHSV (CAST SUPPLIES) ×2 IMPLANT
PAD CAST 4YDX4 CTTN HI CHSV (CAST SUPPLIES) ×2 IMPLANT
PADDING CAST COTTON 3X4 STRL (CAST SUPPLIES) ×1
PADDING CAST COTTON 4X4 STRL (CAST SUPPLIES) ×1
SET CYSTO W/LG BORE CLAMP LF (SET/KITS/TRAYS/PACK) ×3 IMPLANT
SOL PREP POV-IOD 4OZ 10% (MISCELLANEOUS) ×3 IMPLANT
SPECIMEN JAR SMALL (MISCELLANEOUS) IMPLANT
SPLINT FIBERGLASS 4X30 (CAST SUPPLIES) ×3 IMPLANT
SPONGE GAUZE 2X2 STER 10/PKG (GAUZE/BANDAGES/DRESSINGS)
SPONGE T-LAP 4X18 ~~LOC~~+RFID (SPONGE) ×3 IMPLANT
SUCTION FRAZIER HANDLE 10FR (MISCELLANEOUS)
SUCTION TUBE FRAZIER 10FR DISP (MISCELLANEOUS) IMPLANT
SUT FIBERWIRE 3-0 18 DIAM 3/8 (SUTURE) ×9
SUT MERSILENE 4 0 P 3 (SUTURE) IMPLANT
SUT PROLENE 4 0 PS 2 18 (SUTURE) IMPLANT
SUT VIC AB 2-0 CT1 27 (SUTURE)
SUT VIC AB 2-0 CT1 TAPERPNT 27 (SUTURE) IMPLANT
SUTURE FIBERWR 3-0 18 DIAM 3/8 (SUTURE) ×6 IMPLANT
SWAB CULTURE ESWAB REG 1ML (MISCELLANEOUS) IMPLANT
SYR CONTROL 10ML LL (SYRINGE) IMPLANT
TOWEL GREEN STERILE (TOWEL DISPOSABLE) ×3 IMPLANT
TOWEL GREEN STERILE FF (TOWEL DISPOSABLE) ×3 IMPLANT
TUBE CONNECTING 12X1/4 (SUCTIONS) ×3 IMPLANT
UNDERPAD 30X36 HEAVY ABSORB (UNDERPADS AND DIAPERS) ×3 IMPLANT
WATER STERILE IRR 1000ML POUR (IV SOLUTION) ×3 IMPLANT
YANKAUER SUCT BULB TIP NO VENT (SUCTIONS) ×3 IMPLANT

## 2021-07-24 NOTE — Anesthesia Preprocedure Evaluation (Signed)
Anesthesia Evaluation  Patient identified by MRN, date of birth, ID band Patient awake    Reviewed: Allergy & Precautions, NPO status , Patient's Chart, lab work & pertinent test results  Airway Mallampati: II  TM Distance: >3 FB Neck ROM: Full    Dental no notable dental hx.    Pulmonary neg pulmonary ROS, former smoker,    Pulmonary exam normal breath sounds clear to auscultation       Cardiovascular hypertension, Normal cardiovascular exam Rhythm:Regular Rate:Normal     Neuro/Psych negative neurological ROS  negative psych ROS   GI/Hepatic Neg liver ROS, GERD  ,  Endo/Other  negative endocrine ROS  Renal/GU negative Renal ROS  negative genitourinary   Musculoskeletal  (+) Arthritis , Rheumatoid disorders,    Abdominal   Peds negative pediatric ROS (+)  Hematology negative hematology ROS (+)   Anesthesia Other Findings   Reproductive/Obstetrics negative OB ROS                             Anesthesia Physical Anesthesia Plan  ASA: 3  Anesthesia Plan: MAC   Post-op Pain Management:  Regional for Post-op pain   Induction: Intravenous  PONV Risk Score and Plan: 1 and Propofol infusion  Airway Management Planned: Simple Face Mask  Additional Equipment:   Intra-op Plan:   Post-operative Plan:   Informed Consent: I have reviewed the patients History and Physical, chart, labs and discussed the procedure including the risks, benefits and alternatives for the proposed anesthesia with the patient or authorized representative who has indicated his/her understanding and acceptance.     Dental advisory given  Plan Discussed with: CRNA and Surgeon  Anesthesia Plan Comments:         Anesthesia Quick Evaluation

## 2021-07-24 NOTE — Anesthesia Procedure Notes (Signed)
Anesthesia Regional Block: Supraclavicular block   Pre-Anesthetic Checklist: , timeout performed,  Correct Patient, Correct Site, Correct Laterality,  Correct Procedure, Correct Position, site marked,  Risks and benefits discussed,  Surgical consent,  Pre-op evaluation,  At surgeon's request and post-op pain management  Laterality: Right  Prep: chloraprep       Needles:  Injection technique: Single-shot  Needle Type: Echogenic Needle     Needle Length: 9cm      Additional Needles:   Procedures:,,,, ultrasound used (permanent image in chart),,    Narrative:  Start time: 07/24/2021 1:40 PM End time: 07/24/2021 1:50 PM Injection made incrementally with aspirations every 5 mL.  Performed by: Personally  Anesthesiologist: Eilene Ghazi, MD  Additional Notes: Patient tolerated the procedure well without complications

## 2021-07-24 NOTE — H&P (Signed)
Kenneth Roy is an 87 y.o. male.   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 HPI: Patient presents for evaluation and treatment of the of their upper extremity predicament. The patient denies neck, back, chest or  abdominal pain. The patient notes that they have no lower extremity problems. The patients primary complaint is noted. We are planning surgical care pathway for the upper extremity.   Past Medical History:  Diagnosis Date   GERD (gastroesophageal reflux disease)    Hypertension    Osteoarthritis    Rheumatoid arthritis (HCC)     Past Surgical History:  Procedure Laterality Date   ANKLE SURGERY Left    EYE SURGERY     INNER EAR SURGERY Bilateral    JOINT REPLACEMENT     REPLACEMENT TOTAL KNEE Right    RETINAL DETACHMENT SURGERY Right    TONSILLECTOMY     TOTAL HIP ARTHROPLASTY Left 02/26/2021   Procedure: TOTAL HIP ARTHROPLASTY ANTERIOR APPROACH;  Surgeon: Ollen Gross, MD;  Location: [facility];  Service: Orthopedics;  Laterality: Left;    WRIST SURGERY      History reviewed. No pertinent family history. Social History:  reports that he has quit smoking. He has never used smokeless tobacco. He reports current alcohol use. He reports that he does not use drugs.  Allergies: No Known Allergies  Medications Prior to Admission  Medication Sig Dispense Refill   cholecalciferol (VITAMIN D3) 25 MCG (1000 UNIT) tablet Take 1,000 Units by mouth daily.     lisinopril (ZESTRIL) 20 MG tablet Take 20 mg by mouth daily.     Multiple Vitamins-Minerals (CENTRUM SILVER PO) Take 1 tablet by mouth daily.     naproxen sodium (ALEVE) 220 MG tablet Take 660 mg by mouth daily as needed (pain).     sulfaSALAzine (AZULFIDINE) 500 MG tablet Take 1,000 mg by mouth 2 (two) times daily.     HYDROcodone-acetaminophen (NORCO/VICODIN) 5-325 MG tablet Take 1-2 tablets by mouth every 6 (six) hours as needed for  severe pain. (Patient not taking: Reported on 07/13/2021) 30 tablet 0   methocarbamol (ROBAXIN) 500 MG tablet Take 1 tablet (500 mg total) by mouth every 6 (six) hours as needed for muscle spasms. (Patient not taking: Reported on 07/13/2021) 40 tablet 0   traMADol (ULTRAM) 50 MG tablet Take 1-2 tablets (50-100 mg total) by mouth every 6 (six) hours as needed for moderate pain. (Patient not taking: Reported on 07/13/2021) 40 tablet 0    No results found for this or any previous visit (from the past 48 hour(s)). No results found.  Review of Systems  Respiratory: Negative.    Cardiovascular: Negative.    Blood pressure (!) 174/67, pulse 70, temperature (!) 97.5 F (36.4 C), temperature source Oral, resp. rate 18, height 5' 6.5" (1.689 m), weight 67.6 kg, SpO2 100 %. Physical Exam  Patient I discussed all issues he has multiple tendon ruptures about the hand including the extensor apparatus we will plan to proceed with operative exploration with tendon grafting and repair reconstruction is necessary to try and afford him extension to the fingers again.  I discussed with him all issues plans and concerns.  We will plan for an overnight stay and transfer to a skilled nursing arena as he lives alone.  We discussed these issues at length.  Chest is clear.  Abdomen is nontender.  Lower extremity examination is stable.  No significant change in his resting pulse  rate at present time.  He is alert and oriented.  We discussed the procedure in great detail we will move forward accordingly. Assessment/Plan  The patient I discussed all issues.  We will plan to proceed with tendon reconstruction possible distal ulna resection.  I have discussed with him options elbow mass removal and wrist fusion which he declines.  I discussed this with both the patient and his son Kenneth Roy.  We discussed all issues plans and concerns.  We will plan to proceed with surgical intervention.    Patient presents for  evaluation and treatment of the of their upper extremity predicament. The patient denies neck, back, chest or  abdominal pain. The patient notes that they have no lower extremity problems. The patients primary complaint is noted. We are planning surgical care pathway for the upper extremity.   Oletta Cohn III, MD 07/24/2021, 2:13 PM

## 2021-07-24 NOTE — Plan of Care (Signed)

## 2021-07-24 NOTE — Progress Notes (Signed)
Orthopedic Tech Progress Note Patient Details:  Kenneth Roy [birth year] [id number]  Ortho Devices Type of Ortho Device: Arm sling Ortho Device/Splint Location: RUE Ortho Device/Splint Interventions: Ordered      Hellon Vaccarella A Ashlyn Cabler 07/24/2021, 5:07 PM

## 2021-07-24 NOTE — Anesthesia Procedure Notes (Signed)
Anesthesia Procedure Image    

## 2021-07-24 NOTE — Transfer of Care (Signed)
Immediate Anesthesia Transfer of Care Note  Patient: Kenneth Roy  Procedure(s) Performed: TENDON REPAIR OF WRIST, TENDON SYNOVECTOMY AND TENDON TRANSFER AND RECONSTRUCTION,SECONDARY TO MULTIPLE RUPTURES,AND POSSIBLE DISTAL RADIAL ULNAR JOINT RESECTION WITH TENDON TRANSFER (Right: Wrist) TENDON TRANSFER (Right) SYNOVECTOMY (Right)  Patient Location: PACU  Anesthesia Type:MAC and Regional  Level of Consciousness: awake, alert  and oriented  Airway & Oxygen Therapy: Patient Spontanous Breathing  Post-op Assessment: Report given to RN and Post -op Vital signs reviewed and stable  Post vital signs: Reviewed and stable  Last Vitals:  Vitals Value Taken Time  BP 158/78 07/24/21 1606  Temp 36.1 C 07/24/21 1605  Pulse 67 07/24/21 1616  Resp 21 07/24/21 1616  SpO2 100 % 07/24/21 1616  Vitals shown include unvalidated device data.  Last Pain:  Vitals:   07/24/21 1605  TempSrc:   PainSc: 0-No pain         Complications: No notable events documented.

## 2021-07-24 NOTE — Op Note (Signed)
Operative note 07/24/2021  Kenneth Hallum MD  Pre op Dx-advanced inflammatory arthritis with wrist synovitis and index middle ring and small finger tendon ruptures at the wrist hand level.   Post op Dx-the same   Operative report-#1 radical tendon synovectomy second third fourth and fifth dorsal compartments right hand wrist and forearm.  #2 extensor pollicis longus tendon transfer right wrist #3 arthrotomy synovectomy distal radial ulnar joint and radiocarpal and midcarpal joints with tight capsular closure right wrist #4 right wrist extensor carpi radialis brevis tendon transfer to the index middle ring and small finger EDC EIP and EDM with Pulvertaft weave transfer #5 tenotomy ruptured stump fourth dorsal compartment #6 extensive superficial radial nerve neurolysis    Kenneth Rocco MD  Estimated blood loss minimal   Tourniquet time less than 90 minutes   Block with IV sedation  Description of procedure: Patient was seen by myself and anesthesia taken to the operative theater and underwent a smooth induction of IV sedation.  Preoperative block about the right upper extremity was next working fashion.  Timeout was observed body parts well-padded arm was prepped with Hibiclens followed by Betadine scrub and paint that I performed myself.  Sterile field secured arm was elevated and tourniquet was insufflated.  A utilitarian midline incision was made dorsally.  There is a large amount of synovitis and advanced arrangement in my estimation.  At this time I gathered the tendons distally and evaluated the rupture site.  The patient rupture just distal to the wrist.  This was primarily a wrist rupture site.  I gathered the EDM and release this fifth dorsal compartment.  I performed 1/5/EDM tendon transfer to the [facility] of the ring finger with Pulvertaft weave and performed tiedown with FiberWire.  At this time I then created the freshened stump and gathered the [facility] for the index middle and ring finger as well as  the EIP to the index finger.  Following this the proximal stump was identified and was resected.  I then identified the EPL tendon.  The extensor pollicis longus tendon was decompressed and transposed to the dorsal soft tissue.  Exuberant tendon lysis tendon synovectomy was performed about the second third fourth and fifth dorsal compartments without difficulty.  I performed a superficial radial nerve neurolysis as this was encased in inflammatory tissue.  Following this I performed a arthrotomy synovectomy of the radiocarpal midcarpal and distal radial ulnar joint.  I performed a tight capsular closure after this was complete.  This was performed with scissor rondure and orthopedic instruments.  FiberWire was used to close capsule.  The patient specifically did not want a wrist fusion.  Following this we then irrigated copiously followed by Pulvertaft weave of a harvested ECRB tendon to the EDC to the index middle ring fingers.  The patient tolerated this well there are no complicating features.  Tension was set I was pleased this and the findings.  Following the tension being set and looking reasonably well I then performed a very careful and cautious approach to the closure with irrigation followed by skin edge closure.  I was pleased with the tenodesis and the passive motion on the operative table.  Going forward we will plan for immobilization for 4 to 6 weeks followed by progressive therapy and mobilization.  I discussed all issues.  Patient's son is quite aware of all issues plans and concerns.  It was a pleasure to see him today.  He is a very pleasant male hopefully this will increase his quality of  life significantly.  Kenneth Alford MD

## 2021-07-25 ENCOUNTER — Encounter (HOSPITAL_COMMUNITY): Payer: Self-pay | Admitting: Orthopedic Surgery

## 2021-07-25 NOTE — Progress Notes (Signed)
Patient ID: Kenneth Roy, male   DOB: September 21, 1927, 87 y.o.   MRN: [id number] Seen at bedside.  The patient is postop day 1.  The patient had a good night of sleep.  The patient is doing well overall.  I discussed with the patient his findings.  The patient previously was desiring to transition to a skilled nursing facility however he is reconsidered and would like to simply transition home.  We would recommend therapy today and see how he does and then move forward with transition to home tomorrow.  Given his advanced age and limitations with his right arm after surgery I feel that it would be imperative for him to make sure that he demonstrates good competency with his physical abilities prior to transitioning home.  The present time he is alert and oriented.  He is voiding well.  He is tolerating p.o. intake well.  He has no signs of DVT.  Chest is clear and abdomen is nontender.  Overall he looks very well.  We will plan to transition to home tomorrow.  Deryl Giroux MD

## 2021-07-25 NOTE — Evaluation (Signed)
Occupational Therapy Evaluation Patient Details Name: Kenneth Roy MRN: [id number] DOB: [birth year] Today's Date: 07/25/2021   History of Present Illness Admitted for advanced inflammatory arthritis with wrist synovitis and index middle ring and small finger tendon ruptures at the wrist hand level; now s/p surgical repair on 07/24/2021;  has a past medical history of GERD (gastroesophageal reflux disease), Hypertension, Osteoarthritis, and Rheumatoid arthritis (HCC).   Clinical Impression   Patient admitted for the diagnosis and procedure above.  PTA he lives alone, with occasional check ins from family.  He continues to drive, and needed no assist with ADL/IADL, or mobility.  Deficits are listed below.  Currently he is needing up to Min A for basic mobility, and lower body ADL.  Given his current need, and no assist at home, SNF is recommended for post acute rehab to ensure a safe transition home.  OT will follow in the acute setting to maximize his functional status.         Recommendations for follow up therapy are one component of a multi-disciplinary discharge planning process, led by the attending physician.  Recommendations may be updated based on patient status, additional functional criteria and insurance authorization.   Follow Up Recommendations  SNF;Supervision/Assistance - 24 hour    Equipment Recommendations  None recommended by OT    Recommendations for Other Services       Precautions / Restrictions Precautions Precautions: Fall Required Braces or Orthoses: Splint/Cast Splint/Cast: R forearm; sling for comfort support during mobility Restrictions RUE Weight Bearing: Weight bear through elbow only      Mobility Bed Mobility Overal bed mobility: Needs Assistance Bed Mobility: Supine to Sit;Sit to Supine     Supine to sit: Supervision Sit to supine: Supervision     Patient Response: Anxious  Transfers Overall transfer level: Needs assistance Equipment used:  None Transfers: Sit to/from Stand Sit to Stand: Min guard         General transfer comment: Min assist to power up and steady; successful stand after a few tries    Balance Overall balance assessment: Needs assistance Sitting-balance support: Feet supported Sitting balance-Leahy Scale: Good     Standing balance support: No upper extremity supported Standing balance-Leahy Scale: Fair                             ADL either performed or assessed with clinical judgement   ADL Overall ADL's : Needs assistance/impaired Eating/Feeding: Set up;Sitting   Grooming: Oral care;Minimal assistance;Standing   Upper Body Bathing: Min guard;Sitting;Cueing for UE precautions;Cueing for compensatory techniques   Lower Body Bathing: Minimal assistance;Sitting/lateral leans   Upper Body Dressing : Minimal assistance;Sitting   Lower Body Dressing: Minimal assistance;Sitting/lateral leans   Toilet Transfer: Min guard;Ambulation;Regular Toilet           Functional mobility during ADLs: Min guard       Vision Patient Visual Report: No change from baseline                  Pertinent Vitals/Pain Pain Assessment: Faces Faces Pain Scale: Hurts little more Pain Location: R forearm, small finger Pain Descriptors / Indicators: Aching;Throbbing Pain Intervention(s): Monitored during session     Hand Dominance Right   Extremity/Trunk Assessment Upper Extremity Assessment Upper Extremity Assessment: Overall WFL for tasks assessed;RUE deficits/detail RUE Deficits / Details: Forearm splinted RUE: Unable to fully assess due to immobilization RUE Sensation: WNL   Lower Extremity Assessment Lower Extremity Assessment:  Defer to PT evaluation   Cervical / Trunk Assessment Cervical / Trunk Assessment: Normal   Communication Communication Communication: No difficulties   Cognition Arousal/Alertness: Awake/alert Behavior During Therapy: WFL for tasks  assessed/performed Overall Cognitive Status: Within Functional Limits for tasks assessed                                     General Comments  VSS on RA    Exercises General Exercises - Upper Extremity Shoulder Flexion: AROM;Right;10 reps;Seated Shoulder ABduction: AROM;10 reps;Right;Seated Elbow Flexion: AROM;10 reps;Seated;Right Elbow Extension: AROM;Right;10 reps;Seated   Shoulder Instructions      Home Living Family/patient expects to be discharged to:: Private residence Living Arrangements: Alone Available Help at Discharge: Family;Available PRN/intermittently Type of Home: House Home Access: Ramped entrance     Home Layout: Able to live on main level with bedroom/bathroom         Bathroom Toilet: Standard     Home Equipment: Cane - single point          Prior Functioning/Environment Level of Independence: Independent with assistive device(s)        Comments: Cane for amb.  Patient drive, performed his own ADL/IADL, had assist for lawn care and cleaning person every 3rd week.        OT Problem List: Decreased activity tolerance;Impaired balance (sitting and/or standing);Decreased safety awareness;Pain;Impaired UE functional use      OT Treatment/Interventions: Self-care/ADL training;Therapeutic activities;Therapeutic exercise;Patient/family education;DME and/or AE instruction;Balance training    OT Goals(Current goals can be found in the care plan section) Acute Rehab OT Goals Patient Stated Goal: I need to get better before I go home OT Goal Formulation: With patient Time For Goal Achievement: 08/08/21 Potential to Achieve Goals: Good ADL Goals Pt Will Perform Upper Body Bathing: with set-up;standing Pt Will Perform Lower Body Bathing: with set-up;sit to/from stand Pt Will Perform Upper Body Dressing: Independently;standing Pt Will Perform Lower Body Dressing: with set-up;sit to/from stand Pt Will Transfer to Toilet:  Independently;ambulating;regular height toilet  OT Frequency: Min 2X/week   Barriers to D/C: Decreased caregiver support          Co-evaluation              AM-PAC OT "6 Clicks" Daily Activity     Outcome Measure Help from another person eating meals?: A Little Help from another person taking care of personal grooming?: A Little Help from another person toileting, which includes using toliet, bedpan, or urinal?: A Little Help from another person bathing (including washing, rinsing, drying)?: A Little Help from another person to put on and taking off regular upper body clothing?: A Little Help from another person to put on and taking off regular lower body clothing?: A Little 6 Click Score: 18   End of Session Equipment Utilized During Treatment: Gait belt Nurse Communication: Mobility status  Activity Tolerance: Patient tolerated treatment well Patient left: in bed;with call bell/phone within reach;with family/visitor present  OT Visit Diagnosis: Unsteadiness on feet (R26.81);Pain Pain - Right/Left: Right Pain - part of body: Arm                Time: 3500-9381 OT Time Calculation (min): 21 min Charges:  OT General Charges $OT Visit: 1 Visit OT Evaluation $OT Eval Moderate Complexity: 1 Mod  07/25/2021  RP, OTR/L  Acute Rehabilitation Services  Office:  [phone]   Suzanna Obey 07/25/2021, 4:32 PM

## 2021-07-25 NOTE — Progress Notes (Signed)
Physical Therapy Note  PT eval complete with full note to follow;  Recommend SNF for transition out of hospital; (pt would like to try for [facility]); Noted pt had been hoping to be able to transition straight home from the hospital; As we worked, he re-thought going home, and now asks for a brief stay at [facility] for rehab and recovery;  PT in hearty agreement;   Will follow acutely,   Van Clines, PT  Acute Rehabilitation Services Pager [phone] Office [phone]

## 2021-07-25 NOTE — Anesthesia Postprocedure Evaluation (Signed)
Anesthesia Post Note  Patient: Kenneth Roy  Procedure(s) Performed: TENDON REPAIR OF WRIST, TENDON SYNOVECTOMY AND TENDON TRANSFER AND RECONSTRUCTION,SECONDARY TO MULTIPLE RUPTURES,AND POSSIBLE DISTAL RADIAL ULNAR JOINT RESECTION WITH TENDON TRANSFER (Right: Wrist) TENDON TRANSFER (Right) SYNOVECTOMY (Right)     Patient location during evaluation: PACU Anesthesia Type: MAC Level of consciousness: awake and alert Pain management: pain level controlled Vital Signs Assessment: post-procedure vital signs reviewed and stable Respiratory status: spontaneous breathing, nonlabored ventilation, respiratory function stable and patient connected to nasal cannula oxygen Cardiovascular status: stable and blood pressure returned to baseline Postop Assessment: no apparent nausea or vomiting Anesthetic complications: no   No notable events documented.  Last Vitals:  Vitals:   07/24/21 2145 07/25/21 0725  BP: (!) 134/114 136/69  Pulse: 72 73  Resp: 17 17  Temp: 36.7 C 36.6 C  SpO2: 100% 93%    Last Pain:  Vitals:   07/25/21 0725  TempSrc: Oral  PainSc:                  Gaila Engebretsen S

## 2021-07-25 NOTE — Evaluation (Signed)
Physical Therapy Evaluation Patient Details Name: Kenneth Roy MRN: [id number] DOB: Feb 10, 1927 Today's Date: 07/25/2021  History of Present Illness  Admitted for advanced inflammatory arthritis with wrist synovitis and index middle ring and small finger tendon ruptures at the wrist hand level; now s/p surgical repair on 07/24/2021;  has a past medical history of GERD (gastroesophageal reflux disease), Hypertension, Osteoarthritis, and Rheumatoid arthritis (HCC).  Clinical Impression   Patient is s/p above surgery resulting in functional limitations due to the deficits listed below (see PT Problem List). Comes from home where he lives by himself; independent at baseline, walks with a cane, still drives; Presents to PT with unsteady gait and use restrictions of his dominant hand;  Patient will benefit from skilled PT to increase their independence and safety with mobility to allow discharge to the venue listed below.       While AMPAC score may point to dc home, must consider the restrictions on the use of his dominant hand   Recommendations for follow up therapy are one component of a multi-disciplinary discharge planning process, led by the attending physician.  Recommendations may be updated based on patient status, additional functional criteria and insurance authorization.  Follow Up Recommendations SNF    Equipment Recommendations  Cane    Recommendations for Other Services OT consult     Precautions / Restrictions Precautions Precautions: Fall Required Braces or Orthoses: Splint/Cast Splint/Cast: R forearm; sling ordered as well Restrictions RUE Weight Bearing: Weight bear through elbow only      Mobility  Bed Mobility                    Transfers Overall transfer level: Needs assistance Equipment used: None Transfers: Sit to/from Stand Sit to Stand: Min assist         General transfer comment: Min assist to power up and steady; successful stand after a few  tries  Ambulation/Gait Ambulation/Gait assistance: Min guard;Min assist Gait Distance (Feet): 80 Feet (greater than -- hallway ambulationj) Assistive device: Straight cane (and sling RUE) Gait Pattern/deviations: Step-through pattern;Decreased step length - right;Decreased step length - left;Wide base of support;Drifts right/left     General Gait Details: short steps and noting variable step width; much steadier with extra point of stability that the cane gives  Stairs            Wheelchair Mobility    Modified Rankin (Stroke Patients Only)       Balance Overall balance assessment: Needs assistance   Sitting balance-Leahy Scale: Good       Standing balance-Leahy Scale: Fair                               Pertinent Vitals/Pain Pain Assessment: Faces Faces Pain Scale: Hurts a little bit Pain Location: R forearm Pain Descriptors / Indicators: Aching Pain Intervention(s): Monitored during session;Repositioned    Home Living Family/patient expects to be discharged to:: Private residence Living Arrangements: Alone Available Help at Discharge: Family;Available PRN/intermittently (Pt states family can help prn) Type of Home: House Home Access: Ramped entrance     Home Layout: Able to live on main level with bedroom/bathroom Home Equipment: Gilmer Mor - single point      Prior Function Level of Independence: Independent with assistive device(s)         Comments: Cane for amb     Hand Dominance   Dominant Hand: Right    Extremity/Trunk Assessment  Upper Extremity Assessment Upper Extremity Assessment: Defer to OT evaluation;RUE deficits/detail RUE Deficits / Details: Forearm splinted    Lower Extremity Assessment Lower Extremity Assessment: Generalized weakness       Communication   Communication: No difficulties  Cognition Arousal/Alertness: Awake/alert Behavior During Therapy: WFL for tasks assessed/performed Overall Cognitive Status:  Within Functional Limits for tasks assessed                                        General Comments General comments (skin integrity, edema, etc.): Showing insight into deficits and functional status while he can't use his dominant UE for tasks; pt requests to go to SNF for rehab    Exercises     Assessment/Plan    PT Assessment Patient needs continued PT services  PT Problem List Decreased strength;Decreased activity tolerance;Decreased balance;Decreased mobility;Decreased coordination;Decreased knowledge of use of DME;Pain       PT Treatment Interventions DME instruction;Gait training;Stair training;Functional mobility training;Therapeutic activities;Therapeutic exercise;Balance training;Neuromuscular re-education;Patient/family education    PT Goals (Current goals can be found in the Care Plan section)  Acute Rehab PT Goals Patient Stated Goal: Recover well PT Goal Formulation: With patient Time For Goal Achievement: 08/08/21 Potential to Achieve Goals: Good    Frequency Min 3X/week   Barriers to discharge        Co-evaluation               AM-PAC PT "6 Clicks" Mobility  Outcome Measure Help needed turning from your back to your side while in a flat bed without using bedrails?: A Little Help needed moving from lying on your back to sitting on the side of a flat bed without using bedrails?: A Little Help needed moving to and from a bed to a chair (including a wheelchair)?: A Little Help needed standing up from a chair using your arms (e.g., wheelchair or bedside chair)?: A Lot Help needed to walk in hospital room?: A Little Help needed climbing 3-5 steps with a railing? : A Lot 6 Click Score: 16    End of Session Equipment Utilized During Treatment: Gait belt (sling) Activity Tolerance: Patient tolerated treatment well Patient left: in chair;with call bell/phone within reach Nurse Communication: Mobility status PT Visit Diagnosis: Unsteadiness  on feet (R26.81)    Time: 4782-9562 PT Time Calculation (min) (ACUTE ONLY): 24 min   Charges:   PT Evaluation $PT Eval Low Complexity: 1 Low PT Treatments $Gait Training: 8-22 mins        Van Clines, PT  Acute Rehabilitation Services Pager [phone] Office [phone]   Levi Aland 07/25/2021, 4:06 PM

## 2021-07-25 NOTE — NC FL2 (Signed)
MEDICAID FL2 LEVEL OF CARE SCREENING TOOL     IDENTIFICATION  Patient Name: Kenneth Roy Birthdate: 08-Jan-1927 Sex: male Admission Date (Current Location): 07/24/2021  [facility] and [place] Number:  Producer, television/film/video and Address:  [facility] [place], [address], [place], [place] [address]      Provider Number: [id number]  Attending Physician Name and Address:  Dominica Severin, MD  Relative Name and Phone Number:  PEPPER, WYNDHAM)   [phone]    Current Level of Care: Hospital Recommended Level of Care: Skilled Nursing Facility Prior Approval Number:    Date Approved/Denied:   PASRR Number: [id number]  Discharge Plan: SNF    Current Diagnoses: Patient Active Problem List   Diagnosis Date Noted   Rheumatoid arthritis (HCC) 07/24/2021   OA (osteoarthritis) of hip 02/26/2021   Primary osteoarthritis of left hip 02/26/2021    Orientation RESPIRATION BLADDER Height & Weight     Situation, Time, Self  Normal Continent Weight: 149 lb (67.6 kg) Height:  5' 6.5" (168.9 cm)  BEHAVIORAL SYMPTOMS/MOOD NEUROLOGICAL BOWEL NUTRITION STATUS      Continent Diet (see d/c summary)  AMBULATORY STATUS COMMUNICATION OF NEEDS Skin   Limited Assist   Surgical wounds                       Personal Care Assistance Level of Assistance  Bathing, Feeding, Dressing Bathing Assistance: Limited assistance Feeding assistance: Independent Dressing Assistance: Limited assistance     Functional Limitations Info  Sight, Hearing, Speech Sight Info: Adequate Hearing Info: Impaired Speech Info: Adequate    SPECIAL CARE FACTORS FREQUENCY  PT (By licensed PT), OT (By licensed OT)     PT Frequency: 5x/ week OT Frequency: 5x/ week            Contractures Contractures Info: Not present    Additional Factors Info  Code Status, Allergies Code Status Info: Full Allergies Info: NKA           Current Medications (07/25/2021):  This is the  current hospital active medication list Current Facility-Administered Medications  Medication Dose Route Frequency Provider Last Rate Last Admin   acetaminophen (TYLENOL) tablet 325-650 mg  325-650 mg Oral Q6H PRN Dominica Severin, MD   650 mg at 07/24/21 2204   ascorbic acid (VITAMIN C) tablet 1,000 mg  1,000 mg Oral Daily Dominica Severin, MD   1,000 mg at 07/25/21 7893   ceFAZolin (ANCEF) IVPB 1 g/50 mL premix  1 g Intravenous Tommi Rumps, MD 100 mL/hr at 07/25/21 1336 1 g at 07/25/21 1336   ceFAZolin (ANCEF) IVPB 2g/100 mL premix  2 g Intravenous On Call to OR Dominica Severin, MD       cholecalciferol (VITAMIN D3) tablet 1,000 Units  1,000 Units Oral Daily Dominica Severin, MD   1,000 Units at 07/25/21 8101   docusate sodium (COLACE) capsule 100 mg  100 mg Oral BID Dominica Severin, MD       famotidine (PEPCID) tablet 20 mg  20 mg Oral BID PRN Dominica Severin, MD       HYDROcodone-acetaminophen (NORCO) 7.5-325 MG per tablet 1-2 tablet  1-2 tablet Oral Q4H PRN Dominica Severin, MD       HYDROcodone-acetaminophen (NORCO/VICODIN) 5-325 MG per tablet 1-2 tablet  1-2 tablet Oral Q4H PRN Dominica Severin, MD       influenza vaccine adjuvanted (FLUAD) injection 0.5 mL  0.5 mL Intramuscular Tomorrow-1000 Dominica Severin, MD  lactated ringers infusion   Intravenous Continuous Dominica Severin, MD       lisinopril (ZESTRIL) tablet 20 mg  20 mg Oral Daily Dominica Severin, MD   20 mg at 07/25/21 4628   methocarbamol (ROBAXIN) tablet 500 mg  500 mg Oral Q6H PRN Dominica Severin, MD       Or   methocarbamol (ROBAXIN) 500 mg in dextrose 5 % 50 mL IVPB  500 mg Intravenous Q6H PRN Dominica Severin, MD       morphine 2 MG/ML injection 0.5-1 mg  0.5-1 mg Intravenous Q2H PRN Dominica Severin, MD       ondansetron [facility]) tablet 4 mg  4 mg Oral Q6H PRN Dominica Severin, MD       Or   ondansetron [facility]) injection 4 mg  4 mg Intravenous Q6H PRN Dominica Severin, MD       sulfaSALAzine (AZULFIDINE) tablet  1,000 mg  1,000 mg Oral BID Dominica Severin, MD   1,000 mg at 07/25/21 6381   traMADol (ULTRAM) tablet 50 mg  50 mg Oral Q6H Dominica Severin, MD   50 mg at 07/25/21 1721     Discharge Medications: Please see discharge summary for a list of discharge medications.  Relevant Imaging Results:  Relevant Lab Results:   Additional Information SSN:  [id number]; Pfizer COVID-19 Vaccine 11/26/2019 , 11/05/2019 ;  5'6" 149lbs  Ralene Bathe, LCSWA

## 2021-07-26 LAB — RESP PANEL BY RT-PCR (FLU A&B, COVID) ARPGX2
Influenza A by PCR: NEGATIVE
Influenza B by PCR: NEGATIVE
SARS Coronavirus 2 by RT PCR: NEGATIVE

## 2021-07-26 MED ORDER — ONDANSETRON HCL 4 MG PO TABS: 4.0000 mg | ORAL_TABLET | Freq: Four times a day (QID) | ORAL | 0 refills | Status: DC | PRN

## 2021-07-26 MED ORDER — TIZANIDINE HCL 2 MG PO TABS: 2.0000 mg | ORAL_TABLET | Freq: Three times a day (TID) | ORAL | 0 refills | Status: DC | PRN

## 2021-07-26 MED ORDER — HYDROCODONE-ACETAMINOPHEN 5-325 MG PO TABS: 1.0000 | ORAL_TABLET | ORAL | 0 refills | Status: DC | PRN

## 2021-07-26 NOTE — Discharge Summary (Addendum)
Physician Discharge Summary  Patient ID: Kenneth Roy MRN: [id number] DOB/AGE: 1927-06-14 87 y.o.  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 male who was admitted 07/24/2021 with a chief complaint of No chief complaint on file. 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  They were brought to the operating room on 07/24/2021 and underwent Procedure(s): TENDON REPAIR OF WRIST, TENDON SYNOVECTOMY AND TENDON TRANSFER AND RECONSTRUCTION,SECONDARY TO MULTIPLE RUPTURES,AND POSSIBLE DISTAL RADIAL ULNAR JOINT RESECTION WITH TENDON TRANSFER TENDON TRANSFER SYNOVECTOMY.    They were given perioperative antibiotics:  Anti-infectives (From admission, onward)    Start     Dose/Rate Route Frequency Ordered Stop   07/25/21 0600  ceFAZolin (ANCEF) IVPB 2g/100 mL premix        2 g 200 mL/hr over 30 Minutes Intravenous On call to O.R. 07/24/21 1735 07/26/21 0559   07/25/21 0600  ceFAZolin (ANCEF) IVPB 2g/100 mL premix        2 g 200 mL/hr over 30 Minutes Intravenous On call to O.R. 07/24/21 1258 07/24/21 1436   07/25/21 0500  ceFAZolin (ANCEF) IVPB 1 g/50 mL premix        1 g 100 mL/hr over 30 Minutes Intravenous Every 8 hours 07/24/21 1736 08/01/21 0559   07/24/21 1830  ceFAZolin (ANCEF) IVPB 1 g/50 mL  premix        1 g 100 mL/hr over 30 Minutes Intravenous NOW 07/24/21 1736 07/25/21 2250     .  They were given sequential compression devices, early ambulation, and for DVT prophylaxis.  Recent vital signs: Patient Vitals for the past 24 hrs:  BP Temp Temp src Pulse Resp SpO2  07/26/21 0733 (!) 125/102 97.9 F (36.6 C) Oral 75 17 92 %  07/25/21 2135 (!) 182/53 98 F (36.7 C) -- 77 18 97 %  07/25/21 1434 131/71 97.8 F (36.6 C) Oral 81 15 95 %  .  Recent laboratory studies: No results found.  Discharge Medications:   Allergies as of 07/26/2021   No Known Allergies      Medication List     STOP taking these medications    methocarbamol 500 MG tablet Commonly known as: ROBAXIN   traMADol 50 MG tablet Commonly known as: ULTRAM       TAKE these medications    CENTRUM SILVER PO Take 1 tablet by mouth daily.   cholecalciferol 25 MCG (1000 UNIT) tablet Commonly known as: VITAMIN D3 Take 1,000 Units by mouth daily.   HYDROcodone-acetaminophen 5-325 MG tablet Commonly known as: NORCO/VICODIN Take 1-2 tablets by mouth every 6 (six) hours as needed for severe pain. What changed: Another medication with the same name was added. Make sure you understand how and when to take each.   HYDROcodone-acetaminophen 5-325 MG tablet Commonly known as: NORCO/VICODIN Take 1-2 tablets by mouth every 4 (four) hours as needed for moderate pain (  pain score 4-6). What changed: You were already taking a medication with the same name, and this prescription was added. Make sure you understand how and when to take each.   lisinopril 20 MG tablet Commonly known as: ZESTRIL Take 20 mg by mouth daily.   naproxen sodium 220 MG tablet Commonly known as: ALEVE Take 660 mg by mouth daily as needed (pain).   ondansetron 4 MG tablet Commonly known as: ZOFRAN Take 1 tablet (4 mg total) by mouth every 6 (six) hours as needed for nausea.   sulfaSALAzine 500 MG tablet Commonly known as:  AZULFIDINE Take 1,000 mg by mouth 2 (two) times daily.   tiZANidine 2 MG tablet Commonly known as: ZANAFLEX Take 1 tablet (2 mg total) by mouth every 8 (eight) hours as needed for muscle spasms.        Diagnostic Studies: No results found.  They benefited maximally from their hospital stay and there were no complications.     Disposition: Discharge disposition: 03-Skilled Nursing Facility      Discharge Instructions     Call MD / Call 911   Complete by: As directed    If you experience chest pain or shortness of breath, CALL 911 and be transported to the hospital emergency room.  If you develope a fever above 101 F, pus (white drainage) or increased drainage or redness at the wound, or calf pain, call your surgeon's office.   Constipation Prevention   Complete by: As directed    Drink plenty of fluids.  Prune juice may be helpful.  You may use a stool softener, such as Colace (over the counter) 100 mg twice a day.  Use MiraLax (over the counter) for constipation as needed.   Diet - low sodium heart healthy   Complete by: As directed    Increase activity slowly as tolerated   Complete by: As directed    Post-operative opioid taper instructions:   Complete by: As directed    POST-OPERATIVE OPIOID TAPER INSTRUCTIONS: It is important to wean off of your opioid medication as soon as possible. If you do not need pain medication after your surgery it is ok to stop day one. Opioids include: Codeine, Hydrocodone(Norco, Vicodin), Oxycodone(Percocet, oxycontin) and hydromorphone amongst others.  Long term and even short term use of opiods can cause: Increased pain response Dependence Constipation Depression Respiratory depression And more.  Withdrawal symptoms can include Flu like symptoms Nausea, vomiting And more Techniques to manage these symptoms Hydrate well Eat regular healthy meals Stay active Use relaxation techniques(deep breathing, meditating, yoga) Do Not  substitute Alcohol to help with tapering If you have been on opioids for less than two weeks and do not have pain than it is ok to stop all together.  Plan to wean off of opioids This plan should start within one week post op of your joint replacement. Maintain the same interval or time between taking each dose and first decrease the dose.  Cut the total daily intake of opioids by one tablet each day Next start to increase the time between doses. The last dose that should be eliminated is the evening dose.          Follow-up Information     Dominica Severin, MD Follow up.   Specialty: Orthopedic Surgery Why: We would like to see you in our office in 14 days for your follow-up. Contact information: [address] [place] [address] [phone]  Status post surgical reconstruction right hand.  No complicating features.  Patient will be transition to a skilled nursing facility  Patient will notify should any problems occur.  We will look forward to seeing him back in the office in 2 weeks for follow-up with suture removal and splint/cast.  At the time of discharge no signs of DVT infection or other problems.  He is stable ligamentous examination about the operative extremity and in good spirits.  Signed: Oletta Cohn III  Date of discharge July 27, 2021 ,

## 2021-07-26 NOTE — Consult Note (Addendum)
   [facility] Inpatient Consult   07/26/2021  CREIGHTON LONGLEY October 31, 1926 [id number]  [place] [facility] [ACO] Patient: Medicare CMS DCE  Primary Care Provider:  Haywood Pao, MD, [facility]  Patient can be followed by [facility] RN [facility] with traditional Medicare for any known or needs for transitional care needs for returning to post facility care or complex disease management. Medical record reveals patient lived alone prior to admission and was driving as well. Reviewed therapy and inpatient [facility] team notes for post hospital recommendations and needs.  If patient transitions to a SNF affiliated with [facility], can have [facility] RN PAC for following up. Addendum:  07/27/21 1230: Met with patient and let him know to expect [facility] RN PAC follow up at [facility] affiliated facility, he is awaiting the final details of transition he says.  He endorses Joe, his son can be contacted however he states, "I will have my cell phone as well."  For questions or referrals, please contact:   Natividad Brood, RN BSN [facility] Liaison  [phone] business mobile phone Toll free office [phone]  Fax number: [phone] [email or web address]

## 2021-07-26 NOTE — TOC Initial Note (Signed)
Transition of Care [facility]) - Initial/Assessment Note    Patient Details  Name: Kenneth Roy MRN: [id number] Date of Birth: 1927/04/03  Transition of Care [facility]) CM/SW Contact:    Ralene Bathe, LCSWA Phone Number: 07/26/2021, 2:55 PM  Clinical Narrative:                 CSW received consult for possible SNF placement at time of discharge. CSW spoke with patient. Patient reported that he is willing to go to a SNF if it is a high rated one. Patient expressed understanding of PT recommendation and is agreeable to SNF placement at time of discharge. Patient reports preference for [facility] and [facility]. CSW discussed insurance authorization process and will provide Medicare SNF ratings list. Patient has received 3 COVID vaccines. Patient expressed being hopeful for rehab and to feel better soon. No further questions reported at this time.   CSW presented bed offers.  The patient spoke with the son and asked CSW if [facility] was available.    CSW called [facility] the facility will have a bed available tomorrow.    CSW requested a COVID test.  Skilled Nursing Rehab Facilities-   [email or web address] Ratings out of 5 possible    Name Address  Phone # Quality Care Staffing Health Inspection Overall  [facility] [address], [place] [phone] 5 1 4 4   [facility]  [address], [place] [phone] 3 1 5 4   [facility] [address] [place], [address] [place] 3 1 1 1   [facility] [address] 2 2 4 4   [facility] [address] [phone] 3 1 2 1   [facility] [address], [place] [phone] 3 2 4 4   [facility] [address] [place] 5 1 2 2   [facility] [address] [facility] [place] 5 2 2 3   [facility] [address] [facility] 5 1 2 2   [facility] [phone]  [address], [phone] 5 1 2 2   [facility] [address] [facility] [phone] 5 1 2 2   [facility]. [address] 3 1 1 1           Patient Goals and CMS Choice        Expected Discharge Plan and Services           Expected Discharge Date: 07/26/21                                    Prior Living Arrangements/Services                       Activities of Daily Living Home Assistive Devices/Equipment: (specify quad or straight), Eyeglasses, Dentures (specify type), Blood pressure cuff ADL Screening (condition at time of admission) Patient's cognitive ability adequate to safely complete daily activities?: Yes Is the patient deaf or have difficulty hearing?: No Does the patient have difficulty seeing, even when wearing glasses/contacts?: No Does the patient have difficulty concentrating, remembering, or making decisions?: No Patient able to express need for assistance with ADLs?: Yes Does the patient have difficulty dressing or bathing?: No Independently performs ADLs?: Yes (appropriate for developmental age) Does the patient have difficulty walking or climbing stairs?: Yes Weakness of Legs: None Weakness of Arms/Hands: None  Permission Sought/Granted  Emotional Assessment              Admission diagnosis:  Rheumatoid arthritis [facility]) [M06.9] Patient Active Problem List   Diagnosis Date Noted   Rheumatoid arthritis (HCC) 07/24/2021   OA (osteoarthritis) of hip 02/26/2021   Primary osteoarthritis of left hip 02/26/2021   PCP:  Gaspar Garbe, MD Pharmacy:   [id number] [place], [place] - [address] AT [facility] OF [address] & [facility] [address] [place] [address] Phone: [phone] Fax: [phone]     Social Determinants of Health (SDOH) Interventions    Readmission Risk Interventions Readmission Risk Prevention  Plan 02/28/2021  Post Dischage Appt Complete  Medication Screening Complete  Transportation Screening Complete  Some recent data might be hidden

## 2021-07-26 NOTE — Discharge Instructions (Signed)
Please elevate your right arm.  You can move your fingers within the confines of the splint.  Please call us for any problems.  Our office will call so that we can see you back in 2 weeks for your follow-up.Keep bandage clean and dry.  Call for any problems.  No smoking.  Criteria for driving a car: you should be off your pain medicine for 7-8 hours, able to drive one handed(confident), thinking clearly and feeling able in your judgement to drive. Continue elevation as it will decrease swelling.  If instructed by MD move your fingers within the confines of the bandage/splint.  Use ice if instructed by your MD. Call immediately for any sudden loss of feeling in your hand/arm or change in functional abilities of the extremity. We recommend that you to take vitamin C 1000 mg a day to promote healing. We also recommend that if you require  pain medicine that you take a stool softener to prevent constipation as most pain medicines will have constipation side effects. We recommend either Peri-Colace or Senokot and recommend that you also consider adding MiraLAX as well to prevent the constipation affects from pain medicine if you are required to use them. These medicines are over the counter and may be purchased at a local pharmacy. A cup of yogurt and a probiotic can also be helpful during the recovery process as the medicines can disrupt your intestinal environment.

## 2021-07-26 NOTE — Plan of Care (Signed)
  Problem: Activity: Goal: Risk for activity intolerance will decrease Outcome: Progressing   Problem: Pain Managment: Goal: General experience of comfort will improve Outcome: Progressing   Problem: Safety: Goal: Ability to remain free from injury will improve Outcome: Progressing   

## 2021-07-26 NOTE — Progress Notes (Signed)
Patient ID: Kenneth Roy, male   DOB: 09-08-1927, 87 y.o.   MRN: [id number] Patient remained stable.  We will transition to skilled nursing facility once a bed is available.  All forms are completed.  Discharge summary is completed.  The patient is stable awake alert and oriented.  Should any problems occur we will be immediately available.  All questions have been addressed.  Selassie Spatafore MD

## 2021-07-26 NOTE — Plan of Care (Signed)

## 2021-07-27 DIAGNOSIS — M169 Osteoarthritis of hip, unspecified: Secondary | ICD-10-CM | POA: Diagnosis not present

## 2021-07-27 DIAGNOSIS — M159 Polyosteoarthritis, unspecified: Secondary | ICD-10-CM | POA: Diagnosis not present

## 2021-07-27 DIAGNOSIS — Z9889 Other specified postprocedural states: Secondary | ICD-10-CM | POA: Diagnosis not present

## 2021-07-27 DIAGNOSIS — M1612 Unilateral primary osteoarthritis, left hip: Secondary | ICD-10-CM | POA: Diagnosis not present

## 2021-07-27 DIAGNOSIS — M25531 Pain in right wrist: Secondary | ICD-10-CM | POA: Diagnosis not present

## 2021-07-27 DIAGNOSIS — M069 Rheumatoid arthritis, unspecified: Secondary | ICD-10-CM | POA: Diagnosis not present

## 2021-07-27 DIAGNOSIS — Z7409 Other reduced mobility: Secondary | ICD-10-CM | POA: Diagnosis not present

## 2021-07-27 DIAGNOSIS — K219 Gastro-esophageal reflux disease without esophagitis: Secondary | ICD-10-CM | POA: Diagnosis not present

## 2021-07-27 DIAGNOSIS — I10 Essential (primary) hypertension: Secondary | ICD-10-CM | POA: Diagnosis not present

## 2021-07-27 NOTE — Progress Notes (Signed)
Physical Therapy Treatment Patient Details Name: Kenneth Roy MRN: [id number] DOB: 10/14/1927 Today's Date: 07/27/2021   History of Present Illness Admitted for advanced inflammatory arthritis with wrist synovitis and index middle ring and small finger tendon ruptures at the wrist hand level; now s/p surgical repair on 07/24/2021;  has a past medical history of GERD (gastroesophageal reflux disease), Hypertension, Osteoarthritis, and Rheumatoid arthritis (HCC).    PT Comments    Pt. Demos improved activity tolerance and balance this tx session and is able to inc gait distance with limited external support from PT.  Despite excellent progress with functional mobility (and AMPAC score indicating home), pt. Remains limited by immobilization of R dominant UE resulting in him being unable to safely return home alone.  Unless family is willing to temporarily stay with patient, patient would benefit from short term rehab at [facility].  Recommendations for follow up therapy are one component of a multi-disciplinary discharge planning process, led by the attending physician.  Recommendations may be updated based on patient status, additional functional criteria and insurance authorization.  Follow Up Recommendations  SNF (Due to dominant hand being immobilized/NWB, pt. lives alone.)     Equipment Recommendations       Recommendations for Other Services       Precautions / Restrictions Precautions Precautions: Fall Required Braces or Orthoses: Splint/Cast Splint/Cast: R forearm; sling for comfort support during mobility Restrictions Weight Bearing Restrictions: Yes RUE Weight Bearing: Weight bear through elbow only     Mobility  Bed Mobility   Bed Mobility: Sidelying to Sit   Sidelying to sit: Modified independent (Device/Increase time)       General bed mobility comments: Mod I for SL > sit with pt. pushing through L elbow to complete transition.  Needs assist to don sling once in  sitting. Patient Response: Cooperative  Transfers   Equipment used: Surveyor, mining to Stand: Supervision         General transfer comment: Performs transfer with supervision only, takes 2 attempts but able to complete transition without physical assist.  Ambulation/Gait Ambulation/Gait assistance: Supervision Gait Distance (Feet): 400 Feet Assistive device: Straight cane Gait Pattern/deviations: Step-through pattern     General Gait Details: Pt. amb in halls, initially demos shuffling gait with increased postural sway, but demos improved gait biomechanics and balance after ~50 ft.  PT provides CGA initially, but as pt. progresses amb distance is able to dec to supervision only.   Stairs             Wheelchair Mobility    Modified Rankin (Stroke Patients Only)       Balance           Standing balance support: Single extremity supported Standing balance-Leahy Scale: Fair                              Cognition Arousal/Alertness: Awake/alert Behavior During Therapy: WFL for tasks assessed/performed Overall Cognitive Status: Within Functional Limits for tasks assessed                                 General Comments: Pt. is supine in bed when PT arrives.  Agreeable to amb in halls.      Exercises      General Comments        Pertinent Vitals/Pain Pain Assessment: 0-10 Pain Score: 0-No pain  Home Living                      Prior Function            PT Goals (current goals can now be found in the care plan section) Progress towards PT goals: Progressing toward goals    Frequency           PT Plan Current plan remains appropriate    Co-evaluation              AM-PAC PT "6 Clicks" Mobility   Outcome Measure  Help needed turning from your back to your side while in a flat bed without using bedrails?: None Help needed moving from lying on your back to sitting on the side of a flat bed  without using bedrails?: None Help needed moving to and from a bed to a chair (including a wheelchair)?: A Little Help needed standing up from a chair using your arms (e.g., wheelchair or bedside chair)?: A Little Help needed to walk in hospital room?: A Little Help needed climbing 3-5 steps with a railing? : A Little 6 Click Score: 20    End of Session Equipment Utilized During Treatment: Gait belt Activity Tolerance: Patient tolerated treatment well Patient left: in chair;with call bell/phone within reach (No chair alarm, pt. likes to stand to use urinal and demos appropriate balance during tx session.  Pt. aware to not try to amb around room without assist.)         Time: 0917-0932 PT Time Calculation (min) (ACUTE ONLY): 15 min  Charges:  $Gait Training: 8-22 mins                     Dalesha Stanback A. Daizha Anand, PT, DPT Acute Rehabilitation Services Office: [phone]    Kem Hensen A Rynn Markiewicz 07/27/2021, 9:41 AM

## 2021-07-27 NOTE — Plan of Care (Signed)
  Problem: Education: Goal: Knowledge of General Education information will improve Description Including pain rating scale, medication(s)/side effects and non-pharmacologic comfort measures Outcome: Progressing   Problem: Health Behavior/Discharge Planning: Goal: Ability to manage health-related needs will improve Outcome: Progressing   

## 2021-07-27 NOTE — Progress Notes (Signed)
Occupational Therapy Treatment Patient Details Name: Kenneth Roy MRN: [id number] DOB: [birth year] Today's Date: 07/27/2021   History of present illness Admitted for advanced inflammatory arthritis with wrist synovitis and index middle ring and small finger tendon ruptures at the wrist hand level; now s/p surgical repair on 07/24/2021;  has a past medical history of GERD (gastroesophageal reflux disease), Hypertension, Osteoarthritis, and Rheumatoid arthritis (HCC).   OT comments  Patient with incremental progress toward patient focused goals.  Decreased use of R UE and unsteadiness remain deficits impacting independence.  He is scheduled for discharge this date to SNF, which remains appropriate.  Patient is needing up to [facility] for fall prevention, and up to Min A with lower body ADL.  He does not have the appropriate level of assist at home to be safe at this moment.  OT to continue efforts if he remains.     Recommendations for follow up therapy are one component of a multi-disciplinary discharge planning process, led by the attending physician.  Recommendations may be updated based on patient status, additional functional criteria and insurance authorization.    Follow Up Recommendations  SNF;Supervision/Assistance - 24 hour    Equipment Recommendations  None recommended by OT    Recommendations for Other Services      Precautions / Restrictions Precautions Precautions: Fall Required Braces or Orthoses: Splint/Cast Splint/Cast: R forearm; sling for comfort support during mobility Restrictions Weight Bearing Restrictions: Yes RUE Weight Bearing: Weight bear through elbow only       Mobility Bed Mobility   Bed Mobility: Sidelying to Sit   Sidelying to sit: Modified independent (Device/Increase time)       General bed mobility comments: Mod I for SL > sit with pt. pushing through L elbow to complete transition.  Needs assist to don sling once in sitting.    Transfers    Equipment used: Straight cane   Sit to Stand: Supervision         General transfer comment: Performs transfer with supervision only, takes 2 attempts but able to complete transition without physical assist.    Balance           Standing balance support: Single extremity supported Standing balance-Leahy Scale: Fair                             ADL either performed or assessed with clinical judgement   ADL           Upper Body Bathing: Min guard;Sitting;Cueing for UE precautions;Cueing for compensatory techniques   Lower Body Bathing: Minimal assistance;Sitting/lateral leans   Upper Body Dressing : Minimal assistance;Sitting   Lower Body Dressing: Minimal assistance;Sitting/lateral leans   Toilet Transfer: Min guard;Ambulation;Regular Toilet   Toileting- Architect and Hygiene: Supervision/safety;Sit to/from stand       Functional mobility during ADLs: Min guard;Cane                         Cognition Arousal/Alertness: Awake/alert Behavior During Therapy: WFL for tasks assessed/performed Overall Cognitive Status: Within Functional Limits for tasks assessed                                 General Comments: Pt. is supine in bed when PT arrives.  Agreeable to amb in halls.  Pertinent Vitals/ Pain       Pain Assessment: 0-10: 4 to L shoulder, soreness Pain Score: 0-No pain                                                          Frequency  Min 2X/week        Progress Toward Goals  OT Goals(current goals can now be found in the care plan section)  Progress towards OT goals: Progressing toward goals  Acute Rehab OT Goals Patient Stated Goal: I need to get better before I go home OT Goal Formulation: With patient Time For Goal Achievement: 08/08/21 Potential to Achieve Goals: Good  Plan Discharge plan remains appropriate    Co-evaluation                  AM-PAC OT "6 Clicks" Daily Activity     Outcome Measure   Help from another person eating meals?: A Little Help from another person taking care of personal grooming?: A Little Help from another person toileting, which includes using toliet, bedpan, or urinal?: A Little Help from another person bathing (including washing, rinsing, drying)?: A Little Help from another person to put on and taking off regular upper body clothing?: A Little Help from another person to put on and taking off regular lower body clothing?: A Little 6 Click Score: 18    End of Session    OT Visit Diagnosis: Unsteadiness on feet (R26.81);Pain   Activity Tolerance Patient tolerated treatment well   Patient Left in chair;with call bell/phone within reach   Nurse Communication          Time: 1610-9604 OT Time Calculation (min): 23 min  Charges: OT General Charges $OT Visit: 1 Visit OT Treatments $Self Care/Home Management : 23-37 mins  07/27/2021  RP, OTR/L  Acute Rehabilitation Services  Office:  [phone]   Suzanna Obey 07/27/2021, 11:01 AM

## 2021-07-27 NOTE — Progress Notes (Signed)
Patient ID: Kenneth Roy, male   DOB: 1927/02/21, 87 y.o.   MRN: [id number] The patient was seen at bedside this morning.  The patient is ready for discharge.  It appears a bed has opened up.  We will advance him to the skilled nursing facility.  He is alert oriented and has good refill and motion within the confines of his splint.  At the time of discharge he is awake alert and oriented no signs of complication DVT infection or vascular compromise.  Will look forward to seeing him back in the office.  We will call for an appointment to see me in 12 days  [name] MD

## 2021-07-27 NOTE — TOC Transition Note (Signed)
Transition of Care [facility]) - CM/SW Discharge Note   Patient Details  Name: Kenneth Roy MRN: [id number] Date of Birth: 03/08/1927  Transition of Care [facility] CM/SW Contact:  Ralene Bathe, LCSWA Phone Number: 07/27/2021, 10:40 AM   Clinical Narrative:     Patient will DC to: [facility] Anticipated DC date: 07/27/2021 Family notified: Yes Transport by: Family   Per MD patient ready for DC to SNF. RN to call report prior to discharge [phone] room 401. RN, patient, patient's family, and facility notified of DC. Discharge Summary and FL2 sent to facility. DC packet on chart. Family will transport the patient.   CSW will sign off for now as social work intervention is no longer needed. Please consult [place] again if new needs arise.          Patient Goals and CMS Choice        Discharge Placement                       Discharge Plan and Services                                     Social Determinants of Health (SDOH) Interventions     Readmission Risk Interventions Readmission Risk Prevention Plan 02/28/2021  Post Dischage Appt Complete  Medication Screening Complete  Transportation Screening Complete  Some recent data might be hidden

## 2021-07-30 DIAGNOSIS — M069 Rheumatoid arthritis, unspecified: Secondary | ICD-10-CM | POA: Diagnosis not present

## 2021-07-30 DIAGNOSIS — M25531 Pain in right wrist: Secondary | ICD-10-CM | POA: Diagnosis not present

## 2021-07-30 DIAGNOSIS — Z7409 Other reduced mobility: Secondary | ICD-10-CM | POA: Diagnosis not present

## 2021-07-31 DIAGNOSIS — I10 Essential (primary) hypertension: Secondary | ICD-10-CM | POA: Diagnosis not present

## 2021-07-31 DIAGNOSIS — M069 Rheumatoid arthritis, unspecified: Secondary | ICD-10-CM | POA: Diagnosis not present

## 2021-07-31 DIAGNOSIS — Z9889 Other specified postprocedural states: Secondary | ICD-10-CM | POA: Diagnosis not present

## 2021-07-31 DIAGNOSIS — M159 Polyosteoarthritis, unspecified: Secondary | ICD-10-CM | POA: Diagnosis not present

## 2021-08-06 DIAGNOSIS — I10 Essential (primary) hypertension: Secondary | ICD-10-CM | POA: Diagnosis not present

## 2021-08-06 DIAGNOSIS — M069 Rheumatoid arthritis, unspecified: Secondary | ICD-10-CM | POA: Diagnosis not present

## 2021-08-06 DIAGNOSIS — M169 Osteoarthritis of hip, unspecified: Secondary | ICD-10-CM | POA: Diagnosis not present

## 2021-08-08 DIAGNOSIS — Z4789 Encounter for other orthopedic aftercare: Secondary | ICD-10-CM | POA: Diagnosis not present

## 2021-08-09 NOTE — Discharge Summary (Signed)
Physician Discharge Summary  Patient ID: Kenneth Roy MRN: [id number] DOB/AGE: 1927/09/10 87 y.o.  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 male who was admitted 07/24/2021 with a chief complaint of No chief complaint on file. 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  They were brought to the operating room on 07/24/2021 and underwent Procedure(s): TENDON REPAIR OF WRIST, TENDON SYNOVECTOMY AND TENDON TRANSFER AND RECONSTRUCTION,SECONDARY TO MULTIPLE RUPTURES,AND POSSIBLE DISTAL RADIAL ULNAR JOINT RESECTION WITH TENDON TRANSFER TENDON TRANSFER SYNOVECTOMY.    They were given perioperative antibiotics:  Anti-infectives (From admission, onward)    Start     Dose/Rate Route Frequency Ordered Stop   07/25/21 0600  ceFAZolin (ANCEF) IVPB 2g/100 mL premix        2 g 200 mL/hr over 30 Minutes Intravenous On call to O.R. 07/24/21 1735 07/26/21 0559   07/25/21 0600  ceFAZolin (ANCEF) IVPB 2g/100 mL premix        2 g 200 mL/hr over 30 Minutes Intravenous On call to O.R. 07/24/21 1258 07/24/21 1436   07/25/21 0500  ceFAZolin (ANCEF) IVPB 1 g/50 mL premix  Status:  Discontinued        1 g 100 mL/hr over 30 Minutes Intravenous Every 8 hours 07/24/21 1736 07/27/21 2105   07/24/21 1830   ceFAZolin (ANCEF) IVPB 1 g/50 mL premix        1 g 100 mL/hr over 30 Minutes Intravenous NOW 07/24/21 1736 07/25/21 2250     .  They were given sequential compression devices, early ambulation, and   Recent vital signs: No data found..  Recent laboratory studies: No results found.  Discharge Medications:   Allergies as of 07/27/2021   No Known Allergies      Medication List     STOP taking these medications    methocarbamol 500 MG tablet Commonly known as: ROBAXIN   traMADol 50 MG tablet Commonly known as: ULTRAM       TAKE these medications    CENTRUM SILVER PO Take 1 tablet by mouth daily.   cholecalciferol 25 MCG (1000 UNIT) tablet Commonly known as: VITAMIN D3 Take 1,000 Units by mouth daily.   HYDROcodone-acetaminophen 5-325 MG tablet Commonly known as: NORCO/VICODIN Take 1-2 tablets by mouth every 6 (six) hours as needed for severe pain. What changed: Another medication with the same name was added. Make sure you understand how and when to take each.   HYDROcodone-acetaminophen 5-325 MG tablet Commonly known as: NORCO/VICODIN Take 1-2 tablets by mouth every 4 (four) hours as needed for moderate pain (pain score 4-6). What changed: You were already taking a medication with the same name, and this prescription was added. Make sure you understand how and when to take each.   lisinopril 20 MG tablet Commonly known as: ZESTRIL Take 20 mg by mouth daily.   naproxen sodium 220 MG tablet  Commonly known as: ALEVE Take 660 mg by mouth daily as needed (pain).   ondansetron 4 MG tablet Commonly known as: ZOFRAN Take 1 tablet (4 mg total) by mouth every 6 (six) hours as needed for nausea.   sulfaSALAzine 500 MG tablet Commonly known as: AZULFIDINE Take 1,000 mg by mouth 2 (two) times daily.   tiZANidine 2 MG tablet Commonly known as: ZANAFLEX Take 1 tablet (2 mg total) by mouth every 8 (eight) hours as needed for muscle spasms.        Diagnostic  Studies: No results found.  They benefited maximally from their hospital stay and there were no complications.     Disposition: Discharge disposition: 03-Skilled Nursing Facility      Discharge Instructions     Call MD / Call 911   Complete by: As directed    If you experience chest pain or shortness of breath, CALL 911 and be transported to the hospital emergency room.  If you develope a fever above 101 F, pus (white drainage) or increased drainage or redness at the wound, or calf pain, call your surgeon's office.   Constipation Prevention   Complete by: As directed    Drink plenty of fluids.  Prune juice may be helpful.  You may use a stool softener, such as Colace (over the counter) 100 mg twice a day.  Use MiraLax (over the counter) for constipation as needed.   Diet - low sodium heart healthy   Complete by: As directed    Diet - low sodium heart healthy   Complete by: As directed    Increase activity slowly   Complete by: As directed    Increase activity slowly as tolerated   Complete by: As directed    No wound care   Complete by: As directed    Post-operative opioid taper instructions:   Complete by: As directed    POST-OPERATIVE OPIOID TAPER INSTRUCTIONS: It is important to wean off of your opioid medication as soon as possible. If you do not need pain medication after your surgery it is ok to stop day one. Opioids include: Codeine, Hydrocodone(Norco, Vicodin), Oxycodone(Percocet, oxycontin) and hydromorphone amongst others.  Long term and even short term use of opiods can cause: Increased pain response Dependence Constipation Depression Respiratory depression And more.  Withdrawal symptoms can include Flu like symptoms Nausea, vomiting And more Techniques to manage these symptoms Hydrate well Eat regular healthy meals Stay active Use relaxation techniques(deep breathing, meditating, yoga) Do Not substitute Alcohol to help with tapering If you have been on  opioids for less than two weeks and do not have pain than it is ok to stop all together.  Plan to wean off of opioids This plan should start within one week post op of your joint replacement. Maintain the same interval or time between taking each dose and first decrease the dose.  Cut the total daily intake of opioids by one tablet each day Next start to increase the time between doses. The last dose that should be eliminated is the evening dose.          Contact information for follow-up providers     Dominica Severin, MD Follow up.   Specialty: Orthopedic Surgery Why: We would like to see you in our office in 14 days for your follow-up. Contact information: [address] [place] [address] [phone]              Contact information for after-discharge care  Destination     [facility] .   Service: Skilled Nursing Contact information: [address] [place] [address] [phone]                      Signed: Oletta Cohn III 08/09/2021, 6:22 PM

## 2021-08-22 DIAGNOSIS — Z4789 Encounter for other orthopedic aftercare: Secondary | ICD-10-CM | POA: Diagnosis not present

## 2021-08-31 DIAGNOSIS — M25641 Stiffness of right hand, not elsewhere classified: Secondary | ICD-10-CM | POA: Diagnosis not present

## 2021-09-05 DIAGNOSIS — M25641 Stiffness of right hand, not elsewhere classified: Secondary | ICD-10-CM | POA: Diagnosis not present

## 2021-09-13 DIAGNOSIS — M25641 Stiffness of right hand, not elsewhere classified: Secondary | ICD-10-CM | POA: Diagnosis not present

## 2021-09-14 DIAGNOSIS — Z23 Encounter for immunization: Secondary | ICD-10-CM | POA: Diagnosis not present

## 2021-09-14 DIAGNOSIS — L570 Actinic keratosis: Secondary | ICD-10-CM | POA: Diagnosis not present

## 2021-09-14 DIAGNOSIS — L219 Seborrheic dermatitis, unspecified: Secondary | ICD-10-CM | POA: Diagnosis not present

## 2021-09-17 DIAGNOSIS — M0579 Rheumatoid arthritis with rheumatoid factor of multiple sites without organ or systems involvement: Secondary | ICD-10-CM | POA: Diagnosis not present

## 2021-09-20 DIAGNOSIS — M25641 Stiffness of right hand, not elsewhere classified: Secondary | ICD-10-CM | POA: Diagnosis not present

## 2021-09-27 DIAGNOSIS — M25641 Stiffness of right hand, not elsewhere classified: Secondary | ICD-10-CM | POA: Diagnosis not present

## 2021-10-18 ENCOUNTER — Other Ambulatory Visit: Payer: Self-pay | Admitting: *Deleted

## 2021-10-18 NOTE — Patient Outreach (Signed)
Per [facility] eligible member resides in [facility].  Screened for potential [facility] Care Management services.   Communication sent to facility SW to inquire about transition plans.  Will collaborate with facility SW and follow up with resident/family as appropriate.  Will continue to follow for potential [facility] needs and transition plans while member resides in SNF.    Raiford Noble, MSN, RN,BSN [facility] Post Acute Care Coordinator [phone] [facility]) [phone]  (Toll free office)

## 2021-10-22 DIAGNOSIS — Z4789 Encounter for other orthopedic aftercare: Secondary | ICD-10-CM | POA: Diagnosis not present

## 2021-10-22 DIAGNOSIS — R2232 Localized swelling, mass and lump, left upper limb: Secondary | ICD-10-CM | POA: Diagnosis not present

## 2021-10-22 DIAGNOSIS — M67332 Transient synovitis, left wrist: Secondary | ICD-10-CM | POA: Diagnosis not present

## 2021-10-22 DIAGNOSIS — M13832 Other specified arthritis, left wrist: Secondary | ICD-10-CM | POA: Diagnosis not present

## 2021-10-23 DIAGNOSIS — G8929 Other chronic pain: Secondary | ICD-10-CM | POA: Diagnosis not present

## 2021-10-23 DIAGNOSIS — M2550 Pain in unspecified joint: Secondary | ICD-10-CM | POA: Diagnosis not present

## 2021-10-23 DIAGNOSIS — M0579 Rheumatoid arthritis with rheumatoid factor of multiple sites without organ or systems involvement: Secondary | ICD-10-CM | POA: Diagnosis not present

## 2021-10-23 DIAGNOSIS — R229 Localized swelling, mass and lump, unspecified: Secondary | ICD-10-CM | POA: Diagnosis not present

## 2021-10-23 DIAGNOSIS — Z6824 Body mass index (BMI) 24.0-24.9, adult: Secondary | ICD-10-CM | POA: Diagnosis not present

## 2021-10-23 DIAGNOSIS — R5382 Chronic fatigue, unspecified: Secondary | ICD-10-CM | POA: Diagnosis not present

## 2021-11-12 DIAGNOSIS — M0579 Rheumatoid arthritis with rheumatoid factor of multiple sites without organ or systems involvement: Secondary | ICD-10-CM | POA: Diagnosis not present

## 2022-01-07 DIAGNOSIS — M0579 Rheumatoid arthritis with rheumatoid factor of multiple sites without organ or systems involvement: Secondary | ICD-10-CM | POA: Diagnosis not present

## 2022-01-11 DIAGNOSIS — M069 Rheumatoid arthritis, unspecified: Secondary | ICD-10-CM | POA: Diagnosis not present

## 2022-01-11 DIAGNOSIS — I10 Essential (primary) hypertension: Secondary | ICD-10-CM | POA: Diagnosis not present

## 2022-01-11 DIAGNOSIS — M1990 Unspecified osteoarthritis, unspecified site: Secondary | ICD-10-CM | POA: Diagnosis not present

## 2022-01-17 DIAGNOSIS — T162XXA Foreign body in left ear, initial encounter: Secondary | ICD-10-CM | POA: Diagnosis not present

## 2022-03-04 DIAGNOSIS — M0579 Rheumatoid arthritis with rheumatoid factor of multiple sites without organ or systems involvement: Secondary | ICD-10-CM | POA: Diagnosis not present

## 2022-03-04 DIAGNOSIS — Z79899 Other long term (current) drug therapy: Secondary | ICD-10-CM | POA: Diagnosis not present

## 2022-04-29 DIAGNOSIS — R5382 Chronic fatigue, unspecified: Secondary | ICD-10-CM | POA: Diagnosis not present

## 2022-04-29 DIAGNOSIS — M0579 Rheumatoid arthritis with rheumatoid factor of multiple sites without organ or systems involvement: Secondary | ICD-10-CM | POA: Diagnosis not present

## 2022-04-29 DIAGNOSIS — Z111 Encounter for screening for respiratory tuberculosis: Secondary | ICD-10-CM | POA: Diagnosis not present

## 2022-04-29 DIAGNOSIS — R5383 Other fatigue: Secondary | ICD-10-CM | POA: Diagnosis not present

## 2022-04-29 DIAGNOSIS — Z79899 Other long term (current) drug therapy: Secondary | ICD-10-CM | POA: Diagnosis not present

## 2022-05-06 DIAGNOSIS — L03114 Cellulitis of left upper limb: Secondary | ICD-10-CM | POA: Diagnosis not present

## 2022-05-30 DIAGNOSIS — I10 Essential (primary) hypertension: Secondary | ICD-10-CM | POA: Diagnosis not present

## 2022-05-30 DIAGNOSIS — Z125 Encounter for screening for malignant neoplasm of prostate: Secondary | ICD-10-CM | POA: Diagnosis not present

## 2022-05-30 DIAGNOSIS — R7989 Other specified abnormal findings of blood chemistry: Secondary | ICD-10-CM | POA: Diagnosis not present

## 2022-06-06 DIAGNOSIS — M069 Rheumatoid arthritis, unspecified: Secondary | ICD-10-CM | POA: Diagnosis not present

## 2022-06-06 DIAGNOSIS — D692 Other nonthrombocytopenic purpura: Secondary | ICD-10-CM | POA: Diagnosis not present

## 2022-06-06 DIAGNOSIS — Z1331 Encounter for screening for depression: Secondary | ICD-10-CM | POA: Diagnosis not present

## 2022-06-06 DIAGNOSIS — D8989 Other specified disorders involving the immune mechanism, not elsewhere classified: Secondary | ICD-10-CM | POA: Diagnosis not present

## 2022-06-06 DIAGNOSIS — R351 Nocturia: Secondary | ICD-10-CM | POA: Diagnosis not present

## 2022-06-06 DIAGNOSIS — Z0000 Encounter for general adult medical examination without abnormal findings: Secondary | ICD-10-CM | POA: Diagnosis not present

## 2022-06-06 DIAGNOSIS — H7190 Unspecified cholesteatoma, unspecified ear: Secondary | ICD-10-CM | POA: Diagnosis not present

## 2022-06-06 DIAGNOSIS — I10 Essential (primary) hypertension: Secondary | ICD-10-CM | POA: Diagnosis not present

## 2022-06-06 DIAGNOSIS — Z8669 Personal history of other diseases of the nervous system and sense organs: Secondary | ICD-10-CM | POA: Diagnosis not present

## 2022-06-06 DIAGNOSIS — R82998 Other abnormal findings in urine: Secondary | ICD-10-CM | POA: Diagnosis not present

## 2022-06-06 DIAGNOSIS — Z1339 Encounter for screening examination for other mental health and behavioral disorders: Secondary | ICD-10-CM | POA: Diagnosis not present

## 2022-06-06 DIAGNOSIS — M1990 Unspecified osteoarthritis, unspecified site: Secondary | ICD-10-CM | POA: Diagnosis not present

## 2022-06-24 DIAGNOSIS — R5383 Other fatigue: Secondary | ICD-10-CM | POA: Diagnosis not present

## 2022-06-24 DIAGNOSIS — Z79899 Other long term (current) drug therapy: Secondary | ICD-10-CM | POA: Diagnosis not present

## 2022-06-24 DIAGNOSIS — M0579 Rheumatoid arthritis with rheumatoid factor of multiple sites without organ or systems involvement: Secondary | ICD-10-CM | POA: Diagnosis not present

## 2022-06-25 DIAGNOSIS — R5382 Chronic fatigue, unspecified: Secondary | ICD-10-CM | POA: Diagnosis not present

## 2022-06-25 DIAGNOSIS — R229 Localized swelling, mass and lump, unspecified: Secondary | ICD-10-CM | POA: Diagnosis not present

## 2022-06-25 DIAGNOSIS — M0579 Rheumatoid arthritis with rheumatoid factor of multiple sites without organ or systems involvement: Secondary | ICD-10-CM | POA: Diagnosis not present

## 2022-06-25 DIAGNOSIS — Z6824 Body mass index (BMI) 24.0-24.9, adult: Secondary | ICD-10-CM | POA: Diagnosis not present

## 2022-06-25 DIAGNOSIS — G8929 Other chronic pain: Secondary | ICD-10-CM | POA: Diagnosis not present

## 2022-06-25 DIAGNOSIS — M2550 Pain in unspecified joint: Secondary | ICD-10-CM | POA: Diagnosis not present

## 2022-06-27 ENCOUNTER — Encounter: Payer: Self-pay | Admitting: Podiatry

## 2022-06-27 ENCOUNTER — Ambulatory Visit (INDEPENDENT_AMBULATORY_CARE_PROVIDER_SITE_OTHER): Payer: Medicare Other | Admitting: Podiatry

## 2022-06-27 DIAGNOSIS — M79674 Pain in right toe(s): Secondary | ICD-10-CM

## 2022-06-27 DIAGNOSIS — B351 Tinea unguium: Secondary | ICD-10-CM

## 2022-06-27 DIAGNOSIS — M05771 Rheumatoid arthritis with rheumatoid factor of right ankle and foot without organ or systems involvement: Secondary | ICD-10-CM

## 2022-06-27 DIAGNOSIS — M79675 Pain in left toe(s): Secondary | ICD-10-CM | POA: Diagnosis not present

## 2022-06-27 DIAGNOSIS — M05772 Rheumatoid arthritis with rheumatoid factor of left ankle and foot without organ or systems involvement: Secondary | ICD-10-CM | POA: Diagnosis not present

## 2022-06-27 NOTE — Progress Notes (Signed)
  Subjective:  Patient ID: Kenneth Roy, male    DOB: April 24, 1927,  MRN: [id number]  Chief Complaint  Patient presents with   FOOT CARE    Patient is here for routine foot care.    88 y.o. male presents with the above complaint. History confirmed with patient.  Patient presents to the office with thickened elongated painful toenails x10 both feet.  He is unable to take care of them himself.  He reports that they have been discolored and thickened for years now.  Also has a history of rheumatoid arthritis with moderate to severe rheumatoid changes and deformity present bilaterally.  Has a history of having his left ankle fused secondary to his rheumatoid arthritis.  Objective:  Physical Exam: warm, good capillary refill, nail exam onychomycosis of the toenails, onycholysis, and dystrophic nails, no trophic changes or ulcerative lesions. DP pulses palpable, PT pulses palpable, and protective sensation intact Left Foot: bunion deformity noted, hammertoe deformity present lesser digits 2 through 5 Right Foot: bunion deformity noted hammertoe deformity present lesser digits 2 through 5  No images are attached to the encounter.  Assessment:   1. Pain due to onychomycosis of toenails of both feet   2. 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  Onychomycosis with pain  -Nails palliatively debrided as below. -Educated on self-care  Procedure: Nail Debridement Rationale: Pain Type of Debridement: manual, sharp debridement. Instrumentation: Nail nipper, rotary burr. Number of Nails: 10  Return in about 3 months (around 09/26/2022) for RFC.         Corinna Gab, DPM [facility] / [facility]

## 2022-08-07 DIAGNOSIS — Z23 Encounter for immunization: Secondary | ICD-10-CM | POA: Diagnosis not present

## 2022-08-19 DIAGNOSIS — M0579 Rheumatoid arthritis with rheumatoid factor of multiple sites without organ or systems involvement: Secondary | ICD-10-CM | POA: Diagnosis not present

## 2022-08-19 DIAGNOSIS — Z79899 Other long term (current) drug therapy: Secondary | ICD-10-CM | POA: Diagnosis not present

## 2022-09-17 DIAGNOSIS — L570 Actinic keratosis: Secondary | ICD-10-CM | POA: Diagnosis not present

## 2022-09-17 DIAGNOSIS — Z23 Encounter for immunization: Secondary | ICD-10-CM | POA: Diagnosis not present

## 2022-09-26 ENCOUNTER — Ambulatory Visit (INDEPENDENT_AMBULATORY_CARE_PROVIDER_SITE_OTHER): Payer: Medicare Other | Admitting: Podiatry

## 2022-09-26 DIAGNOSIS — B351 Tinea unguium: Secondary | ICD-10-CM

## 2022-09-26 DIAGNOSIS — M05772 Rheumatoid arthritis with rheumatoid factor of left ankle and foot without organ or systems involvement: Secondary | ICD-10-CM

## 2022-09-26 DIAGNOSIS — M79675 Pain in left toe(s): Secondary | ICD-10-CM

## 2022-09-26 DIAGNOSIS — M79674 Pain in right toe(s): Secondary | ICD-10-CM

## 2022-09-26 DIAGNOSIS — M05771 Rheumatoid arthritis with rheumatoid factor of right ankle and foot without organ or systems involvement: Secondary | ICD-10-CM

## 2022-09-26 NOTE — Progress Notes (Signed)
  Subjective:  Patient ID: Kenneth Roy, male    DOB: 10-29-1926,  MRN: [id number]  Chief Complaint  Patient presents with   routine foot care     88 y.o. male presents with the above complaint. History confirmed with patient.  Patient presents to the office with thickened elongated painful toenails x10 both feet.  He is unable to take care of them himself.  He reports that they have been discolored and thickened for years now.  Also has a history of rheumatoid arthritis with moderate to severe rheumatoid changes and deformity present bilaterally.  Has a history of having his left ankle fused secondary to his rheumatoid arthritis.  Objective:  Physical Exam: warm, good capillary refill, nail exam onychomycosis of the toenails, onycholysis, and dystrophic nails, no trophic changes or ulcerative lesions. DP pulses palpable, PT pulses palpable, and protective sensation intact Left Foot: bunion deformity noted, hammertoe deformity present lesser digits 2 through 5 Right Foot: bunion deformity noted hammertoe deformity present lesser digits 2 through 5  No images are attached to the encounter.  Assessment:   1. Pain due to onychomycosis of toenails of both feet   2. Rheumatoid arthritis involving both feet with positive rheumatoid factor (HCC)       Plan:  Patient was evaluated and treated and all questions answered.  Onychomycosis with pain  -Nails palliatively debrided as below. -Educated on self-care  Procedure: Nail Debridement Rationale: Pain Type of Debridement: manual, sharp debridement. Instrumentation: Nail nipper, rotary burr. Number of Nails: 10  No follow-ups on file.         Corinna Gab, DPM [facility] / [facility]

## 2022-10-15 DIAGNOSIS — M0579 Rheumatoid arthritis with rheumatoid factor of multiple sites without organ or systems involvement: Secondary | ICD-10-CM | POA: Diagnosis not present

## 2022-12-03 DIAGNOSIS — Z9889 Other specified postprocedural states: Secondary | ICD-10-CM | POA: Diagnosis not present

## 2022-12-03 DIAGNOSIS — H748X3 Other specified disorders of middle ear and mastoid, bilateral: Secondary | ICD-10-CM | POA: Diagnosis not present

## 2022-12-10 DIAGNOSIS — M0579 Rheumatoid arthritis with rheumatoid factor of multiple sites without organ or systems involvement: Secondary | ICD-10-CM | POA: Diagnosis not present

## 2022-12-24 DIAGNOSIS — M1991 Primary osteoarthritis, unspecified site: Secondary | ICD-10-CM | POA: Diagnosis not present

## 2022-12-24 DIAGNOSIS — Z6825 Body mass index (BMI) 25.0-25.9, adult: Secondary | ICD-10-CM | POA: Diagnosis not present

## 2022-12-24 DIAGNOSIS — E663 Overweight: Secondary | ICD-10-CM | POA: Diagnosis not present

## 2022-12-24 DIAGNOSIS — R229 Localized swelling, mass and lump, unspecified: Secondary | ICD-10-CM | POA: Diagnosis not present

## 2022-12-24 DIAGNOSIS — M0579 Rheumatoid arthritis with rheumatoid factor of multiple sites without organ or systems involvement: Secondary | ICD-10-CM | POA: Diagnosis not present

## 2022-12-24 DIAGNOSIS — R5382 Chronic fatigue, unspecified: Secondary | ICD-10-CM | POA: Diagnosis not present

## 2022-12-24 DIAGNOSIS — G8929 Other chronic pain: Secondary | ICD-10-CM | POA: Diagnosis not present

## 2022-12-26 ENCOUNTER — Ambulatory Visit (INDEPENDENT_AMBULATORY_CARE_PROVIDER_SITE_OTHER): Payer: Medicare Other | Admitting: Podiatry

## 2022-12-26 DIAGNOSIS — M05772 Rheumatoid arthritis with rheumatoid factor of left ankle and foot without organ or systems involvement: Secondary | ICD-10-CM

## 2022-12-26 DIAGNOSIS — M79674 Pain in right toe(s): Secondary | ICD-10-CM

## 2022-12-26 DIAGNOSIS — B351 Tinea unguium: Secondary | ICD-10-CM

## 2022-12-26 DIAGNOSIS — M79675 Pain in left toe(s): Secondary | ICD-10-CM | POA: Diagnosis not present

## 2022-12-26 DIAGNOSIS — M05771 Rheumatoid arthritis with rheumatoid factor of right ankle and foot without organ or systems involvement: Secondary | ICD-10-CM

## 2022-12-26 NOTE — Progress Notes (Signed)
  Subjective:  Patient ID: MARKEZ DOWLAND, male    DOB: Apr 10, 1927,  MRN: [id number]  Chief Complaint  Patient presents with   Nail Problem    RFC    89 y.o. male presents with the above complaint. History confirmed with patient.  Patient presents to the office with thickened elongated painful toenails x10 both feet.  He is unable to take care of them himself.  He reports that they have been discolored and thickened for years now.  Also has a history of rheumatoid arthritis with moderate to severe rheumatoid changes and deformity present bilaterally.  Has a history of having his left ankle fused secondary to his rheumatoid arthritis.  Objective:  Physical Exam: warm, good capillary refill, nail exam onychomycosis of the toenails, onycholysis, and dystrophic nails, no trophic changes or ulcerative lesions. DP pulses palpable, PT pulses palpable, and protective sensation intact Left Foot: bunion deformity noted, hammertoe deformity present lesser digits 2 through 5 Right Foot: bunion deformity noted hammertoe deformity present lesser digits 2 through 5  No images are attached to the encounter.  Assessment:   1. Pain due to onychomycosis of toenails of both feet   2. Rheumatoid arthritis involving both feet with positive rheumatoid factor ([place])        Plan:  Patient was evaluated and treated and all questions answered.  Onychomycosis with pain  -Nails palliatively debrided as below. -Educated on self-care  Procedure: Nail Debridement Rationale: Pain Type of Debridement: manual, sharp debridement. Instrumentation: Nail nipper, rotary burr. Number of Nails: 10  Return in about 3 months (around 03/28/2023) for RFC.         Everitt Amber, DPM [facility] / [facility]

## 2023-01-09 DIAGNOSIS — L570 Actinic keratosis: Secondary | ICD-10-CM | POA: Diagnosis not present

## 2023-02-04 DIAGNOSIS — M0579 Rheumatoid arthritis with rheumatoid factor of multiple sites without organ or systems involvement: Secondary | ICD-10-CM | POA: Diagnosis not present

## 2023-03-06 ENCOUNTER — Ambulatory Visit (INDEPENDENT_AMBULATORY_CARE_PROVIDER_SITE_OTHER): Payer: Medicare Other | Admitting: Podiatry

## 2023-03-06 DIAGNOSIS — M79675 Pain in left toe(s): Secondary | ICD-10-CM | POA: Diagnosis not present

## 2023-03-06 DIAGNOSIS — M05772 Rheumatoid arthritis with rheumatoid factor of left ankle and foot without organ or systems involvement: Secondary | ICD-10-CM

## 2023-03-06 DIAGNOSIS — L97522 Non-pressure chronic ulcer of other part of left foot with fat layer exposed: Secondary | ICD-10-CM

## 2023-03-06 DIAGNOSIS — M79674 Pain in right toe(s): Secondary | ICD-10-CM | POA: Diagnosis not present

## 2023-03-06 DIAGNOSIS — M05771 Rheumatoid arthritis with rheumatoid factor of right ankle and foot without organ or systems involvement: Secondary | ICD-10-CM

## 2023-03-06 DIAGNOSIS — B351 Tinea unguium: Secondary | ICD-10-CM

## 2023-03-06 NOTE — Progress Notes (Addendum)
  Subjective:  Patient ID: Kenneth Roy, male    DOB: [birth year],  MRN: [id number]  Chief Complaint  Patient presents with   Nail Problem    Routine Foot Care     89 y.o. male presents with the above complaint. History confirmed with patient.  Patient presents to the office with thickened elongated painful toenails x10 both feet.  He is unable to take care of them himself.  He reports that they have been discolored and thickened for years now.  Also has a history of rheumatoid arthritis with moderate to severe rheumatoid changes and deformity present bilaterally.  Has a history of having his left ankle fused secondary to his rheumatoid arthritis.  Objective:  Physical Exam: warm, good capillary refill, nail exam onychomycosis of the toenails, onycholysis, and dystrophic nails, no trophic changes or ulcerative lesions. DP pulses palpable, PT pulses palpable, and protective sensation intact Left Foot: bunion deformity noted, hammertoe deformity present lesser digits 2 through 5 On the left fourth toe dorsal aspect there is a hyperkeratotic lesion upon debridement yields a small superficial ulceration to the level of subcutaneous fat tissue.  This measures approximately 0.5 x 0.3 x 0.2 cm.  Postdebridement.  There is healthy tissue underlying the ulceration with no evidence of infection. Right Foot: bunion deformity noted hammertoe deformity present lesser digits 2 through 5  No images are attached to the encounter.  Assessment:   1. Pain due to onychomycosis of toenails of both feet   2. Rheumatoid arthritis involving both feet with positive rheumatoid factor (HCC)     Plan:  Patient was evaluated and treated and all questions answered.  # Ulcer of left fourth toe dorsal PIPJ secondary to hammertoe deformity -We discussed the etiology and factors that are a part of the wound healing process.  We also discussed the risk of infection both soft tissue and osteomyelitis from open ulceration.   Discussed the risk of limb loss if this happens or worsens. -Debridement as below. -Dressed with betadine, DSD. -Continue home dressing changes daily with Betadine and Band-Aid -Continue off-loading with gel spacer. -Last antibiotics: No antibiotics indicated -Imaging: Deferred to superficial nature of wound  Procedure: Excisional Debridement of Wound Rationale: Removal of non-viable soft tissue from the wound to promote healing.  Anesthesia: none Post-Debridement Wound Measurements: 0.5 cm x 0.3 cm x 0.2 cm  Type of Debridement: Sharp Excisional Tissue Removed: Non-viable soft tissue Depth of Debridement: subcutaneous tissue. Technique: Sharp excisional debridement to bleeding, viable wound base.  Dressing: Dry, sterile, compression dressing. Disposition: Patient tolerated procedure well.   Return in about 9 weeks (around 05/08/2023) for RFC.       Onychomycosis with pain  -Nails palliatively debrided as below. -Educated on self-care  Procedure: Nail Debridement Rationale: Pain Type of Debridement: manual, sharp debridement. Instrumentation: Nail nipper, rotary burr. Number of Nails: 10  Return in about 9 weeks (around 05/08/2023) for RFC.         Corinna Gab, DPM [facility] / [facility]

## 2023-03-17 DIAGNOSIS — H60392 Other infective otitis externa, left ear: Secondary | ICD-10-CM | POA: Diagnosis not present

## 2023-03-17 DIAGNOSIS — T162XXA Foreign body in left ear, initial encounter: Secondary | ICD-10-CM | POA: Diagnosis not present

## 2023-04-22 DIAGNOSIS — M0579 Rheumatoid arthritis with rheumatoid factor of multiple sites without organ or systems involvement: Secondary | ICD-10-CM | POA: Diagnosis not present

## 2023-05-09 ENCOUNTER — Ambulatory Visit: Payer: Medicare Other | Admitting: Podiatry

## 2023-05-09 DIAGNOSIS — M79674 Pain in right toe(s): Secondary | ICD-10-CM | POA: Diagnosis not present

## 2023-05-09 DIAGNOSIS — B351 Tinea unguium: Secondary | ICD-10-CM

## 2023-05-09 DIAGNOSIS — M79675 Pain in left toe(s): Secondary | ICD-10-CM | POA: Diagnosis not present

## 2023-05-09 DIAGNOSIS — M05772 Rheumatoid arthritis with rheumatoid factor of left ankle and foot without organ or systems involvement: Secondary | ICD-10-CM

## 2023-05-09 DIAGNOSIS — M05771 Rheumatoid arthritis with rheumatoid factor of right ankle and foot without organ or systems involvement: Secondary | ICD-10-CM

## 2023-05-09 NOTE — Progress Notes (Signed)
  Subjective:  Patient ID: Kenneth Roy, male    DOB: 11-07-1926,  MRN: [id number]  Chief Complaint  Patient presents with   Nail Problem    Routine Foot Care-nail trim     89 y.o. male presents with the above complaint. History confirmed with patient.  Patient presents to the office with thickened elongated painful toenails x10 both feet.  He is unable to take care of them himself.  He reports that they have been discolored and thickened for years now.  Also has a history of rheumatoid arthritis with moderate to severe rheumatoid changes and deformity present bilaterally.  Has a history of having his left ankle fused secondary to his rheumatoid arthritis.  Objective:  Physical Exam: warm, good capillary refill, nail exam onychomycosis of the toenails, onycholysis, and dystrophic nails, no trophic changes or ulcerative lesions. DP pulses palpable, PT pulses palpable, and protective sensation intact Left Foot: bunion deformity noted, hammertoe deformity present lesser digits 2 through 5 On the left fourth toe dorsal aspect there is an eshcar at site of prior ulcer.  There is healthy tissue underlying the ulceration with no evidence of infection. Right Foot: bunion deformity noted hammertoe deformity present lesser digits 2 through 5  No images are attached to the encounter.  Assessment:   1. Pain due to onychomycosis of toenails of both feet   2. 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  # Ulcer of left fourth toe dorsal PIPJ secondary to hammertoe deformity -Fully healed at this time - Recommend continued use of gel toe cap for offloading   Onychomycosis with pain  -Nails palliatively debrided as below. -Educated on self-care  Procedure: Nail Debridement Rationale: Pain Type of Debridement: manual, sharp debridement. Instrumentation: Nail nipper, rotary burr. Number  of Nails: 10  Return in about 9 weeks (around 07/11/2023) for RFC.         Corinna Gab, DPM [facility] / [facility]

## 2023-06-03 DIAGNOSIS — L570 Actinic keratosis: Secondary | ICD-10-CM | POA: Diagnosis not present

## 2023-06-13 DIAGNOSIS — I10 Essential (primary) hypertension: Secondary | ICD-10-CM | POA: Diagnosis not present

## 2023-06-13 DIAGNOSIS — R351 Nocturia: Secondary | ICD-10-CM | POA: Diagnosis not present

## 2023-06-18 DIAGNOSIS — M0579 Rheumatoid arthritis with rheumatoid factor of multiple sites without organ or systems involvement: Secondary | ICD-10-CM | POA: Diagnosis not present

## 2023-06-18 DIAGNOSIS — Z79899 Other long term (current) drug therapy: Secondary | ICD-10-CM | POA: Diagnosis not present

## 2023-06-18 DIAGNOSIS — Z111 Encounter for screening for respiratory tuberculosis: Secondary | ICD-10-CM | POA: Diagnosis not present

## 2023-06-18 DIAGNOSIS — R5383 Other fatigue: Secondary | ICD-10-CM | POA: Diagnosis not present

## 2023-06-20 DIAGNOSIS — Z1339 Encounter for screening examination for other mental health and behavioral disorders: Secondary | ICD-10-CM | POA: Diagnosis not present

## 2023-06-20 DIAGNOSIS — Z23 Encounter for immunization: Secondary | ICD-10-CM | POA: Diagnosis not present

## 2023-06-20 DIAGNOSIS — R82998 Other abnormal findings in urine: Secondary | ICD-10-CM | POA: Diagnosis not present

## 2023-06-20 DIAGNOSIS — R351 Nocturia: Secondary | ICD-10-CM | POA: Diagnosis not present

## 2023-06-20 DIAGNOSIS — D8989 Other specified disorders involving the immune mechanism, not elsewhere classified: Secondary | ICD-10-CM | POA: Diagnosis not present

## 2023-06-20 DIAGNOSIS — Z0000 Encounter for general adult medical examination without abnormal findings: Secondary | ICD-10-CM | POA: Diagnosis not present

## 2023-06-20 DIAGNOSIS — H9193 Unspecified hearing loss, bilateral: Secondary | ICD-10-CM | POA: Diagnosis not present

## 2023-06-20 DIAGNOSIS — Z1331 Encounter for screening for depression: Secondary | ICD-10-CM | POA: Diagnosis not present

## 2023-06-20 DIAGNOSIS — I10 Essential (primary) hypertension: Secondary | ICD-10-CM | POA: Diagnosis not present

## 2023-06-20 DIAGNOSIS — D692 Other nonthrombocytopenic purpura: Secondary | ICD-10-CM | POA: Diagnosis not present

## 2023-06-20 DIAGNOSIS — M069 Rheumatoid arthritis, unspecified: Secondary | ICD-10-CM | POA: Diagnosis not present

## 2023-06-20 DIAGNOSIS — H7190 Unspecified cholesteatoma, unspecified ear: Secondary | ICD-10-CM | POA: Diagnosis not present

## 2023-06-20 DIAGNOSIS — M1990 Unspecified osteoarthritis, unspecified site: Secondary | ICD-10-CM | POA: Diagnosis not present

## 2023-07-01 DIAGNOSIS — R5382 Chronic fatigue, unspecified: Secondary | ICD-10-CM | POA: Diagnosis not present

## 2023-07-01 DIAGNOSIS — G8929 Other chronic pain: Secondary | ICD-10-CM | POA: Diagnosis not present

## 2023-07-01 DIAGNOSIS — E663 Overweight: Secondary | ICD-10-CM | POA: Diagnosis not present

## 2023-07-01 DIAGNOSIS — R229 Localized swelling, mass and lump, unspecified: Secondary | ICD-10-CM | POA: Diagnosis not present

## 2023-07-01 DIAGNOSIS — M1991 Primary osteoarthritis, unspecified site: Secondary | ICD-10-CM | POA: Diagnosis not present

## 2023-07-01 DIAGNOSIS — M0579 Rheumatoid arthritis with rheumatoid factor of multiple sites without organ or systems involvement: Secondary | ICD-10-CM | POA: Diagnosis not present

## 2023-07-01 DIAGNOSIS — M2550 Pain in unspecified joint: Secondary | ICD-10-CM | POA: Diagnosis not present

## 2023-07-01 DIAGNOSIS — Z6825 Body mass index (BMI) 25.0-25.9, adult: Secondary | ICD-10-CM | POA: Diagnosis not present

## 2023-07-11 ENCOUNTER — Ambulatory Visit (INDEPENDENT_AMBULATORY_CARE_PROVIDER_SITE_OTHER): Payer: Medicare Other | Admitting: Podiatry

## 2023-07-11 ENCOUNTER — Encounter: Payer: Self-pay | Admitting: Podiatry

## 2023-07-11 VITALS — Ht 66.25 in | Wt 150.0 lb

## 2023-07-11 DIAGNOSIS — M79674 Pain in right toe(s): Secondary | ICD-10-CM | POA: Diagnosis not present

## 2023-07-11 DIAGNOSIS — M05772 Rheumatoid arthritis with rheumatoid factor of left ankle and foot without organ or systems involvement: Secondary | ICD-10-CM

## 2023-07-11 DIAGNOSIS — M05771 Rheumatoid arthritis with rheumatoid factor of right ankle and foot without organ or systems involvement: Secondary | ICD-10-CM

## 2023-07-11 DIAGNOSIS — M79675 Pain in left toe(s): Secondary | ICD-10-CM | POA: Diagnosis not present

## 2023-07-11 DIAGNOSIS — B351 Tinea unguium: Secondary | ICD-10-CM

## 2023-07-11 NOTE — Progress Notes (Signed)
Subjective:  Patient ID: DRESON NAZARENO, male    DOB: 1927/06/14,  MRN: [id number]  Chief Complaint  Patient presents with   Nail Problem    Patient is here for RFC    89 y.o. male presents with the above complaint. History confirmed with patient.  Patient presents to the office with thickened elongated painful toenails x10 both feet.  He is unable to take care of them himself.  He reports that they have been discolored and thickened for years now.  Also has a history of rheumatoid arthritis with moderate to severe rheumatoid changes and deformity present bilaterally.  Has a history of having his left ankle fused secondary to his rheumatoid arthritis.  Objective:  Physical Exam: warm, good capillary refill, nail exam onychomycosis of the toenails, onycholysis, and dystrophic nails, no trophic changes or ulcerative lesions. DP pulses palpable, PT pulses palpable, and protective sensation intact Left Foot: bunion deformity noted, hammertoe deformity present lesser digits 2 through 5 On the left fourth toe dorsal aspect there is an eshcar at site of prior ulcer.  There is healthy tissue underlying the ulceration with no evidence of infection. Right Foot: bunion deformity noted hammertoe deformity present lesser digits 2 through 5  No images are attached to the encounter.  Assessment:   1. Pain due to onychomycosis of toenails of both feet   2. Rheumatoid arthritis involving both feet with positive rheumatoid factor (HCC)       Plan:  Patient was evaluated and treated and all questions answered.   Onychomycosis with pain  -Nails palliatively debrided as below. -Educated on self-care  Procedure: Nail Debridement Rationale: Pain Type of Debridement: manual, sharp debridement. Instrumentation: Nail nipper, rotary burr. Number of Nails: 10  Return in about 3 months (around 10/10/2023) for RFC.         Corinna Gab, DPM [facility] / [facility]

## 2023-08-13 DIAGNOSIS — R5383 Other fatigue: Secondary | ICD-10-CM | POA: Diagnosis not present

## 2023-08-13 DIAGNOSIS — M0579 Rheumatoid arthritis with rheumatoid factor of multiple sites without organ or systems involvement: Secondary | ICD-10-CM | POA: Diagnosis not present

## 2023-08-13 DIAGNOSIS — Z79899 Other long term (current) drug therapy: Secondary | ICD-10-CM | POA: Diagnosis not present

## 2023-09-04 DIAGNOSIS — L570 Actinic keratosis: Secondary | ICD-10-CM | POA: Diagnosis not present

## 2023-09-19 ENCOUNTER — Ambulatory Visit: Payer: Medicare Other | Admitting: Podiatry

## 2023-09-26 ENCOUNTER — Encounter: Payer: Self-pay | Admitting: Podiatry

## 2023-09-26 ENCOUNTER — Ambulatory Visit (INDEPENDENT_AMBULATORY_CARE_PROVIDER_SITE_OTHER): Payer: Medicare Other | Admitting: Podiatry

## 2023-09-26 DIAGNOSIS — M2042 Other hammer toe(s) (acquired), left foot: Secondary | ICD-10-CM

## 2023-09-26 DIAGNOSIS — M05771 Rheumatoid arthritis with rheumatoid factor of right ankle and foot without organ or systems involvement: Secondary | ICD-10-CM

## 2023-09-26 DIAGNOSIS — M79675 Pain in left toe(s): Secondary | ICD-10-CM | POA: Diagnosis not present

## 2023-09-26 DIAGNOSIS — M05772 Rheumatoid arthritis with rheumatoid factor of left ankle and foot without organ or systems involvement: Secondary | ICD-10-CM

## 2023-09-26 DIAGNOSIS — M2041 Other hammer toe(s) (acquired), right foot: Secondary | ICD-10-CM

## 2023-09-26 DIAGNOSIS — M79674 Pain in right toe(s): Secondary | ICD-10-CM

## 2023-09-26 DIAGNOSIS — B351 Tinea unguium: Secondary | ICD-10-CM | POA: Diagnosis not present

## 2023-09-28 NOTE — Progress Notes (Signed)
  Subjective:  Patient ID: Kenneth Roy, male    DOB: September 29, 1927,  MRN: [id number]  Chief Complaint  Patient presents with   Nail Problem    Rfc.    89 y.o. male presents with the above complaint. History confirmed with patient.  Patient presents to the office with thickened elongated painful toenails x10 both feet.  He is unable to take care of them himself.  He reports that they have been discolored and thickened for years now.  Also has a history of rheumatoid arthritis with moderate to severe rheumatoid changes and deformity present bilaterally.  Has a history of having his left ankle fused secondary to his rheumatoid arthritis.  He does have right foot bunion he has been using a gel spacer for.  He does endorse having hammertoes bilaterally with painful lesion present to the left fourth toe.  Objective:  Physical Exam: warm, good capillary refill, nail exam onychomycosis of the toenails, onycholysis, and dystrophic nails, no trophic changes or ulcerative lesions. DP pulses palpable, PT pulses palpable, and protective sensation intact Left Foot: bunion deformity noted, hammertoe deformity present lesser digits 2 through 5 On the left fourth toe dorsal aspect there is tender corn, overall mild tissue here. Right Foot: bunion deformity noted hammertoe deformity present lesser digits 2 through 5  No images are attached to the encounter.  Assessment:   1. Pain due to onychomycosis of toenails of both feet   2. Rheumatoid arthritis involving both feet with positive rheumatoid factor (HCC)   3. Acquired hammertoes of both feet       Plan:  Patient was evaluated and treated and all questions answered.   Onychomycosis with pain  -Nails palliatively debrided as below. -Educated on self-care  Procedure: Nail Debridement Rationale: Pain Type of Debridement: manual, sharp debridement. Instrumentation: Nail nipper, rotary burr. Number of Nails: 10  # Painful hammertoes with lesion to  the left fourth toe -We will continue with conservative management - Gel toe cap x 1 dispensed to protect irritated corn from friction. -No debridement performed today.  Return in about 9 weeks (around 11/28/2023) for Routine Foot Care.         Bronwen Betters, DPM [facility] / [facility]

## 2023-10-10 DIAGNOSIS — M0579 Rheumatoid arthritis with rheumatoid factor of multiple sites without organ or systems involvement: Secondary | ICD-10-CM | POA: Diagnosis not present

## 2023-10-10 DIAGNOSIS — R5383 Other fatigue: Secondary | ICD-10-CM | POA: Diagnosis not present

## 2023-10-10 DIAGNOSIS — Z79899 Other long term (current) drug therapy: Secondary | ICD-10-CM | POA: Diagnosis not present

## 2023-11-28 ENCOUNTER — Encounter: Payer: Self-pay | Admitting: Podiatry

## 2023-11-28 ENCOUNTER — Ambulatory Visit (INDEPENDENT_AMBULATORY_CARE_PROVIDER_SITE_OTHER): Payer: Medicare Other | Admitting: Podiatry

## 2023-11-28 DIAGNOSIS — M79674 Pain in right toe(s): Secondary | ICD-10-CM | POA: Diagnosis not present

## 2023-11-28 DIAGNOSIS — M79675 Pain in left toe(s): Secondary | ICD-10-CM | POA: Diagnosis not present

## 2023-11-28 DIAGNOSIS — B351 Tinea unguium: Secondary | ICD-10-CM

## 2023-11-28 DIAGNOSIS — M05772 Rheumatoid arthritis with rheumatoid factor of left ankle and foot without organ or systems involvement: Secondary | ICD-10-CM

## 2023-11-28 DIAGNOSIS — M05771 Rheumatoid arthritis with rheumatoid factor of right ankle and foot without organ or systems involvement: Secondary | ICD-10-CM

## 2023-11-28 NOTE — Progress Notes (Signed)
  Subjective:  Patient ID: Kenneth Roy, male    DOB: 11-11-1926,  MRN: [id number]  Chief Complaint  Patient presents with   Nail Problem    RFC    89 y.o. male presents with the above complaint. History confirmed with patient.  Patient presents to the office with thickened elongated painful toenails x10 both feet.  He is unable to take care of them himself.  He reports that they have been discolored and thickened for years now.  Also has a history of rheumatoid arthritis with moderate to severe rheumatoid changes and deformity present bilaterally.  Has a history of having his left ankle fused secondary to his rheumatoid arthritis.  He does have right foot bunion he has been using a gel spacer for.  He does endorse having hammertoes bilaterally with painful lesion present to the left fourth toe.  Objective:  Physical Exam: warm, good capillary refill, nail exam onychomycosis of the toenails, onycholysis, and dystrophic nails, no trophic changes or ulcerative lesions. DP pulses palpable, PT pulses palpable, and protective sensation intact Left Foot: bunion deformity noted, hammertoe deformity present lesser digits 2 through 5 Left 4th toe corn improved from previous Right Foot: bunion deformity noted hammertoe deformity present lesser digits 2 through 5  No images are attached to the encounter.  Assessment:   1. Pain due to onychomycosis of toenails of both feet   2. Rheumatoid arthritis involving both feet with positive rheumatoid factor (HCC)       Plan:  Patient was evaluated and treated and all questions answered.   Onychomycosis with pain  -Nails palliatively debrided as below. -Educated on self-care  Procedure: Nail Debridement Rationale: Pain Type of Debridement: manual, sharp debridement. Instrumentation: Nail nipper, rotary burr. Number of Nails: 10  # Painful hammertoes with lesion to the left fourth toe -We will continue with conservative management - Gel toe cap x  1 dispensed to protect irritated corn from friction. -No debridement performed today.  Return in about 9 weeks (around 01/30/2024) for Routine Foot Care.         Bronwen Betters, DPM [facility] / [facility]

## 2023-12-05 DIAGNOSIS — Z79899 Other long term (current) drug therapy: Secondary | ICD-10-CM | POA: Diagnosis not present

## 2023-12-05 DIAGNOSIS — R5383 Other fatigue: Secondary | ICD-10-CM | POA: Diagnosis not present

## 2023-12-05 DIAGNOSIS — M0579 Rheumatoid arthritis with rheumatoid factor of multiple sites without organ or systems involvement: Secondary | ICD-10-CM | POA: Diagnosis not present

## 2023-12-30 DIAGNOSIS — Z79899 Other long term (current) drug therapy: Secondary | ICD-10-CM | POA: Diagnosis not present

## 2023-12-30 DIAGNOSIS — Z6825 Body mass index (BMI) 25.0-25.9, adult: Secondary | ICD-10-CM | POA: Diagnosis not present

## 2023-12-30 DIAGNOSIS — M0630 Rheumatoid nodule, unspecified site: Secondary | ICD-10-CM | POA: Diagnosis not present

## 2023-12-30 DIAGNOSIS — E663 Overweight: Secondary | ICD-10-CM | POA: Diagnosis not present

## 2023-12-30 DIAGNOSIS — M0579 Rheumatoid arthritis with rheumatoid factor of multiple sites without organ or systems involvement: Secondary | ICD-10-CM | POA: Diagnosis not present

## 2023-12-30 DIAGNOSIS — M1991 Primary osteoarthritis, unspecified site: Secondary | ICD-10-CM | POA: Diagnosis not present

## 2024-01-01 DIAGNOSIS — L570 Actinic keratosis: Secondary | ICD-10-CM | POA: Diagnosis not present

## 2024-01-01 DIAGNOSIS — L723 Sebaceous cyst: Secondary | ICD-10-CM | POA: Diagnosis not present

## 2024-01-30 ENCOUNTER — Ambulatory Visit: Payer: Medicare Other | Admitting: Podiatry

## 2024-02-02 DIAGNOSIS — R5383 Other fatigue: Secondary | ICD-10-CM | POA: Diagnosis not present

## 2024-02-02 DIAGNOSIS — Z79899 Other long term (current) drug therapy: Secondary | ICD-10-CM | POA: Diagnosis not present

## 2024-02-02 DIAGNOSIS — M0579 Rheumatoid arthritis with rheumatoid factor of multiple sites without organ or systems involvement: Secondary | ICD-10-CM | POA: Diagnosis not present

## 2024-02-12 ENCOUNTER — Ambulatory Visit: Admitting: Podiatry

## 2024-02-26 ENCOUNTER — Ambulatory Visit (INDEPENDENT_AMBULATORY_CARE_PROVIDER_SITE_OTHER): Admitting: Podiatry

## 2024-02-26 VITALS — Ht 66.0 in | Wt 150.0 lb

## 2024-02-26 DIAGNOSIS — M05772 Rheumatoid arthritis with rheumatoid factor of left ankle and foot without organ or systems involvement: Secondary | ICD-10-CM

## 2024-02-26 DIAGNOSIS — B351 Tinea unguium: Secondary | ICD-10-CM | POA: Diagnosis not present

## 2024-02-26 DIAGNOSIS — M2042 Other hammer toe(s) (acquired), left foot: Secondary | ICD-10-CM

## 2024-02-26 DIAGNOSIS — M79674 Pain in right toe(s): Secondary | ICD-10-CM | POA: Diagnosis not present

## 2024-02-26 DIAGNOSIS — M05771 Rheumatoid arthritis with rheumatoid factor of right ankle and foot without organ or systems involvement: Secondary | ICD-10-CM

## 2024-02-26 DIAGNOSIS — M79675 Pain in left toe(s): Secondary | ICD-10-CM | POA: Diagnosis not present

## 2024-02-26 DIAGNOSIS — M2041 Other hammer toe(s) (acquired), right foot: Secondary | ICD-10-CM

## 2024-02-26 NOTE — Progress Notes (Signed)
  Subjective:  Patient ID: Kenneth Roy, male    DOB: 1927-05-06,  MRN: [id number]  Chief Complaint  Patient presents with   Nail Problem    Pt is here for RFC.    89 y.o. male presents with the above complaint. History confirmed with patient.  Patient presents to the office with thickened elongated painful toenails x10 both feet.  He is unable to take care of them himself.  He reports that they have been discolored and thickened for years now.  Also has a history of rheumatoid arthritis with moderate to severe rheumatoid changes and deformity present bilaterally.  Has a history of having his left ankle fused secondary to his rheumatoid arthritis.  He does have right foot bunion he has been using a gel spacer for.  He does endorse having hammertoes bilaterally with painful lesion present to the left fourth toe.  Objective:  Physical Exam: warm, good capillary refill, nail exam onychomycosis of the toenails, onycholysis, and dystrophic nails, no trophic changes or ulcerative lesions. DP pulses palpable, PT pulses palpable, and protective sensation intact Left Foot: bunion deformity noted, hammertoe deformity present lesser digits 2 through 5.  Left fourth toe corn present.  Nail plates x 5 are tender on direct dorsal palpation. Right Foot: bunion deformity noted hammertoe deformity present lesser digits 2 through 5.  Nail plates x 5 are tender on direct dorsal palpation  No images are attached to the encounter.  Assessment:   1. Pain due to onychomycosis of toenails of both feet   2. Rheumatoid arthritis involving both feet with positive rheumatoid factor (HCC)   3. Acquired hammertoes of both feet       Plan:  Patient was evaluated and treated and all questions answered.   Onychomycosis with pain  -Nails palliatively debrided as below. -Educated on self-care  Procedure: Nail Debridement Rationale: Pain Type of Debridement: manual, sharp debridement. Instrumentation: Nail nipper,  rotary burr. Number of Nails: 10  # Painful hammertoes with lesion to the left fourth toe -We will continue with conservative management - Gel toe cap x 1 dispensed to protect irritated corn from friction. - Associated corn was sharply debrided today as a courtesy using a 312 scalpel blade without incident.  Return in about 3 months (around 05/28/2024) for Routine Foot Care.         Eve Hinders, DPM [facility] / [facility]

## 2024-02-29 ENCOUNTER — Encounter: Payer: Self-pay | Admitting: Podiatry

## 2024-03-29 DIAGNOSIS — Z79899 Other long term (current) drug therapy: Secondary | ICD-10-CM | POA: Diagnosis not present

## 2024-03-29 DIAGNOSIS — M0579 Rheumatoid arthritis with rheumatoid factor of multiple sites without organ or systems involvement: Secondary | ICD-10-CM | POA: Diagnosis not present

## 2024-04-30 ENCOUNTER — Encounter: Payer: Self-pay | Admitting: Podiatry

## 2024-04-30 ENCOUNTER — Ambulatory Visit (INDEPENDENT_AMBULATORY_CARE_PROVIDER_SITE_OTHER): Admitting: Podiatry

## 2024-04-30 DIAGNOSIS — M79674 Pain in right toe(s): Secondary | ICD-10-CM

## 2024-04-30 DIAGNOSIS — M05772 Rheumatoid arthritis with rheumatoid factor of left ankle and foot without organ or systems involvement: Secondary | ICD-10-CM

## 2024-04-30 DIAGNOSIS — B351 Tinea unguium: Secondary | ICD-10-CM | POA: Diagnosis not present

## 2024-04-30 DIAGNOSIS — M05771 Rheumatoid arthritis with rheumatoid factor of right ankle and foot without organ or systems involvement: Secondary | ICD-10-CM

## 2024-04-30 DIAGNOSIS — M79675 Pain in left toe(s): Secondary | ICD-10-CM

## 2024-04-30 NOTE — Progress Notes (Signed)
  Subjective:  Patient ID: Kenneth Roy, male    DOB: 1927/02/10,  MRN: [id number]  Chief Complaint  Patient presents with   Nail Problem    RFC    89 y.o. male presents with the above complaint. History confirmed with patient.  Patient presents to the office with thickened elongated painful toenails x10 both feet.  He is unable to take care of them himself.  He reports that they have been discolored and thickened for years now.  Also has a history of rheumatoid arthritis with moderate to severe rheumatoid changes and deformity present bilaterally.  Has a history of having his left ankle fused secondary to his rheumatoid arthritis.  He does have right foot bunion he has been using a gel spacer for.  He does endorse having hammertoes bilaterally with painful lesion present to the left fourth toe.  Objective:  Physical Exam: warm, good capillary refill, nail exam onychomycosis of the toenails, onycholysis, and dystrophic nails, no trophic changes or ulcerative lesions. DP pulses palpable, PT pulses palpable, and protective sensation intact Left Foot: bunion deformity noted, hammertoe deformity present lesser digits 2 through 5.  Nail plates are tender on direct dorsal palpation Left 4th toe corn improved from previous Right Foot: bunion deformity noted hammertoe deformity present lesser digits 2 through 5. Nail plates are tender on direct dorsal palpation  No images are attached to the encounter.  Assessment:   1. Pain due to onychomycosis of toenails of both feet   2. Rheumatoid arthritis involving both feet with positive rheumatoid factor (HCC)       Plan:  Patient was evaluated and treated and all questions answered.   Onychomycosis with pain  -Nails palliatively debrided as below. -Educated on self-care  Procedure: Nail Debridement Rationale: Pain Type of Debridement: manual, sharp debridement. Instrumentation: Nail nipper, rotary burr. Number of Nails: 10  # Painful bunions  and hammertoe deformities associated with rheumatoid arthritis - Gel pads and toe spacers were dispensed today.  Return in about 9 weeks (around 07/02/2024) for Routine Foot Care.         Ethan Saddler, DPM [facility] / [facility]

## 2024-05-20 DIAGNOSIS — L570 Actinic keratosis: Secondary | ICD-10-CM | POA: Diagnosis not present

## 2024-05-20 DIAGNOSIS — L905 Scar conditions and fibrosis of skin: Secondary | ICD-10-CM | POA: Diagnosis not present

## 2024-05-24 DIAGNOSIS — M0579 Rheumatoid arthritis with rheumatoid factor of multiple sites without organ or systems involvement: Secondary | ICD-10-CM | POA: Diagnosis not present

## 2024-06-18 ENCOUNTER — Inpatient Hospital Stay (HOSPITAL_COMMUNITY)
Admission: EM | Admit: 2024-06-18 | Discharge: 2024-06-23 | DRG: 025 | Disposition: A | Discharge status: Hospice | Attending: Neurological Surgery | Admitting: Neurological Surgery

## 2024-06-18 ENCOUNTER — Encounter (HOSPITAL_COMMUNITY): Payer: Self-pay

## 2024-06-18 ENCOUNTER — Emergency Department (HOSPITAL_COMMUNITY): Admitting: Registered Nurse

## 2024-06-18 ENCOUNTER — Emergency Department (HOSPITAL_COMMUNITY)

## 2024-06-18 ENCOUNTER — Other Ambulatory Visit: Payer: Self-pay

## 2024-06-18 ENCOUNTER — Encounter (HOSPITAL_COMMUNITY)
Admission: EM | Disposition: A | Discharge status: Hospice | Payer: Self-pay | Source: Home / Self Care | Attending: Neurological Surgery

## 2024-06-18 DIAGNOSIS — J9601 Acute respiratory failure with hypoxia: Secondary | ICD-10-CM | POA: Diagnosis present

## 2024-06-18 DIAGNOSIS — Z96642 Presence of left artificial hip joint: Secondary | ICD-10-CM | POA: Diagnosis not present

## 2024-06-18 DIAGNOSIS — Z515 Encounter for palliative care: Secondary | ICD-10-CM

## 2024-06-18 DIAGNOSIS — Y92019 Unspecified place in single-family (private) house as the place of occurrence of the external cause: Secondary | ICD-10-CM | POA: Diagnosis not present

## 2024-06-18 DIAGNOSIS — I10 Essential (primary) hypertension: Secondary | ICD-10-CM

## 2024-06-18 DIAGNOSIS — Z96651 Presence of right artificial knee joint: Secondary | ICD-10-CM | POA: Diagnosis present

## 2024-06-18 DIAGNOSIS — R451 Restlessness and agitation: Secondary | ICD-10-CM | POA: Diagnosis present

## 2024-06-18 DIAGNOSIS — S065XAA Traumatic subdural hemorrhage with loss of consciousness status unknown, initial encounter: Secondary | ICD-10-CM | POA: Diagnosis not present

## 2024-06-18 DIAGNOSIS — Z66 Do not resuscitate: Secondary | ICD-10-CM | POA: Diagnosis not present

## 2024-06-18 DIAGNOSIS — Z789 Other specified health status: Secondary | ICD-10-CM | POA: Diagnosis not present

## 2024-06-18 DIAGNOSIS — S41111A Laceration without foreign body of right upper arm, initial encounter: Secondary | ICD-10-CM | POA: Diagnosis not present

## 2024-06-18 DIAGNOSIS — G40901 Epilepsy, unspecified, not intractable, with status epilepticus: Secondary | ICD-10-CM | POA: Diagnosis not present

## 2024-06-18 DIAGNOSIS — M069 Rheumatoid arthritis, unspecified: Secondary | ICD-10-CM | POA: Diagnosis present

## 2024-06-18 DIAGNOSIS — S065X0A Traumatic subdural hemorrhage without loss of consciousness, initial encounter: Secondary | ICD-10-CM | POA: Diagnosis not present

## 2024-06-18 DIAGNOSIS — G4089 Other seizures: Secondary | ICD-10-CM | POA: Diagnosis not present

## 2024-06-18 DIAGNOSIS — G9340 Encephalopathy, unspecified: Secondary | ICD-10-CM | POA: Diagnosis not present

## 2024-06-18 DIAGNOSIS — L89311 Pressure ulcer of right buttock, stage 1: Secondary | ICD-10-CM | POA: Diagnosis not present

## 2024-06-18 DIAGNOSIS — S06A0XS Traumatic brain compression without herniation, sequela: Secondary | ICD-10-CM | POA: Diagnosis not present

## 2024-06-18 DIAGNOSIS — R4701 Aphasia: Secondary | ICD-10-CM | POA: Diagnosis present

## 2024-06-18 DIAGNOSIS — S065XAS Traumatic subdural hemorrhage with loss of consciousness status unknown, sequela: Secondary | ICD-10-CM | POA: Diagnosis not present

## 2024-06-18 DIAGNOSIS — R54 Age-related physical debility: Secondary | ICD-10-CM | POA: Diagnosis present

## 2024-06-18 DIAGNOSIS — R296 Repeated falls: Secondary | ICD-10-CM | POA: Diagnosis not present

## 2024-06-18 DIAGNOSIS — R4182 Altered mental status, unspecified: Secondary | ICD-10-CM | POA: Diagnosis not present

## 2024-06-18 DIAGNOSIS — S199XXA Unspecified injury of neck, initial encounter: Secondary | ICD-10-CM | POA: Diagnosis not present

## 2024-06-18 DIAGNOSIS — Z87820 Personal history of traumatic brain injury: Secondary | ICD-10-CM | POA: Diagnosis not present

## 2024-06-18 DIAGNOSIS — Z79899 Other long term (current) drug therapy: Secondary | ICD-10-CM

## 2024-06-18 DIAGNOSIS — K219 Gastro-esophageal reflux disease without esophagitis: Secondary | ICD-10-CM | POA: Diagnosis not present

## 2024-06-18 DIAGNOSIS — S91311A Laceration without foreign body, right foot, initial encounter: Secondary | ICD-10-CM | POA: Diagnosis present

## 2024-06-18 DIAGNOSIS — Z87891 Personal history of nicotine dependence: Secondary | ICD-10-CM | POA: Diagnosis not present

## 2024-06-18 DIAGNOSIS — Z982 Presence of cerebrospinal fluid drainage device: Secondary | ICD-10-CM | POA: Diagnosis not present

## 2024-06-18 DIAGNOSIS — I6203 Nontraumatic chronic subdural hemorrhage: Secondary | ICD-10-CM | POA: Diagnosis present

## 2024-06-18 DIAGNOSIS — W19XXXA Unspecified fall, initial encounter: Secondary | ICD-10-CM | POA: Diagnosis present

## 2024-06-18 DIAGNOSIS — M19071 Primary osteoarthritis, right ankle and foot: Secondary | ICD-10-CM | POA: Diagnosis not present

## 2024-06-18 DIAGNOSIS — L8990 Pressure ulcer of unspecified site, unspecified stage: Secondary | ICD-10-CM | POA: Insufficient documentation

## 2024-06-18 DIAGNOSIS — X58XXXS Exposure to other specified factors, sequela: Secondary | ICD-10-CM | POA: Diagnosis present

## 2024-06-18 DIAGNOSIS — R569 Unspecified convulsions: Secondary | ICD-10-CM | POA: Diagnosis not present

## 2024-06-18 DIAGNOSIS — S0990XA Unspecified injury of head, initial encounter: Secondary | ICD-10-CM | POA: Diagnosis not present

## 2024-06-18 DIAGNOSIS — M19019 Primary osteoarthritis, unspecified shoulder: Secondary | ICD-10-CM | POA: Diagnosis not present

## 2024-06-18 DIAGNOSIS — Z7189 Other specified counseling: Secondary | ICD-10-CM | POA: Diagnosis not present

## 2024-06-18 DIAGNOSIS — R531 Weakness: Secondary | ICD-10-CM | POA: Diagnosis not present

## 2024-06-18 DIAGNOSIS — M542 Cervicalgia: Secondary | ICD-10-CM | POA: Diagnosis not present

## 2024-06-18 DIAGNOSIS — R519 Headache, unspecified: Secondary | ICD-10-CM | POA: Diagnosis not present

## 2024-06-18 DIAGNOSIS — I6200 Nontraumatic subdural hemorrhage, unspecified: Secondary | ICD-10-CM | POA: Diagnosis not present

## 2024-06-18 DIAGNOSIS — M7989 Other specified soft tissue disorders: Secondary | ICD-10-CM | POA: Diagnosis not present

## 2024-06-18 DIAGNOSIS — G9389 Other specified disorders of brain: Secondary | ICD-10-CM | POA: Diagnosis not present

## 2024-06-18 DIAGNOSIS — R4589 Other symptoms and signs involving emotional state: Secondary | ICD-10-CM | POA: Diagnosis not present

## 2024-06-18 HISTORY — PX: BURR HOLE: SHX908

## 2024-06-18 LAB — CBC WITH DIFFERENTIAL/PLATELET
Abs Immature Granulocytes: 0.03 K/uL (ref 0.00–0.07)
Basophils Absolute: 0 K/uL (ref 0.0–0.1)
Basophils Relative: 0 %
Eosinophils Absolute: 0 K/uL (ref 0.0–0.5)
Eosinophils Relative: 0 %
HCT: 41.7 % (ref 39.0–52.0)
Hemoglobin: 14.4 g/dL (ref 13.0–17.0)
Immature Granulocytes: 0 %
Lymphocytes Relative: 11 %
Lymphs Abs: 0.9 K/uL (ref 0.7–4.0)
MCH: 34.8 pg — ABNORMAL HIGH (ref 26.0–34.0)
MCHC: 34.5 g/dL (ref 30.0–36.0)
MCV: 100.7 fL — ABNORMAL HIGH (ref 80.0–100.0)
Monocytes Absolute: 1 K/uL (ref 0.1–1.0)
Monocytes Relative: 13 %
Neutro Abs: 5.7 K/uL (ref 1.7–7.7)
Neutrophils Relative %: 76 %
Platelets: 184 K/uL (ref 150–400)
RBC: 4.14 MIL/uL — ABNORMAL LOW (ref 4.22–5.81)
RDW: 14 % (ref 11.5–15.5)
WBC: 7.6 K/uL (ref 4.0–10.5)
nRBC: 0 % (ref 0.0–0.2)

## 2024-06-18 LAB — I-STAT CHEM 8, ED
BUN: 10 mg/dL (ref 8–23)
Calcium, Ion: 0.98 mmol/L — ABNORMAL LOW (ref 1.15–1.40)
Chloride: 98 mmol/L (ref 98–111)
Creatinine, Ser: 0.5 mg/dL — ABNORMAL LOW (ref 0.61–1.24)
Glucose, Bld: 100 mg/dL — ABNORMAL HIGH (ref 70–99)
HCT: 42 % (ref 39.0–52.0)
Hemoglobin: 14.3 g/dL (ref 13.0–17.0)
Potassium: 3.5 mmol/L (ref 3.5–5.1)
Sodium: 132 mmol/L — ABNORMAL LOW (ref 135–145)
TCO2: 19 mmol/L — ABNORMAL LOW (ref 22–32)

## 2024-06-18 LAB — URINALYSIS, W/ REFLEX TO CULTURE (INFECTION SUSPECTED)
Bilirubin Urine: NEGATIVE
Glucose, UA: NEGATIVE mg/dL
Ketones, ur: 80 mg/dL — AB
Leukocytes,Ua: NEGATIVE
Nitrite: NEGATIVE
Protein, ur: 100 mg/dL — AB
Specific Gravity, Urine: 1.021 (ref 1.005–1.030)
pH: 5 (ref 5.0–8.0)

## 2024-06-18 LAB — COMPREHENSIVE METABOLIC PANEL WITH GFR
ALT: 21 U/L (ref 0–44)
AST: 37 U/L (ref 15–41)
Albumin: 3 g/dL — ABNORMAL LOW (ref 3.5–5.0)
Alkaline Phosphatase: 60 U/L (ref 38–126)
Anion gap: 15 (ref 5–15)
BUN: 10 mg/dL (ref 8–23)
CO2: 19 mmol/L — ABNORMAL LOW (ref 22–32)
Calcium: 7.7 mg/dL — ABNORMAL LOW (ref 8.9–10.3)
Chloride: 97 mmol/L — ABNORMAL LOW (ref 98–111)
Creatinine, Ser: 0.79 mg/dL (ref 0.61–1.24)
GFR, Estimated: >60 mL/min (ref >60)
Glucose, Bld: 99 mg/dL (ref 70–99)
Potassium: 3.5 mmol/L (ref 3.5–5.1)
Sodium: 131 mmol/L — ABNORMAL LOW (ref 135–145)
Total Bilirubin: 2.8 mg/dL — ABNORMAL HIGH (ref 0.0–1.2)
Total Protein: 6.6 g/dL (ref 6.5–8.1)

## 2024-06-18 LAB — URINALYSIS, ROUTINE W REFLEX MICROSCOPIC
Bacteria, UA: NONE SEEN
Bilirubin Urine: NEGATIVE
Glucose, UA: NEGATIVE mg/dL
Ketones, ur: 80 mg/dL — AB
Leukocytes,Ua: NEGATIVE
Nitrite: NEGATIVE
Protein, ur: 100 mg/dL — AB
Specific Gravity, Urine: 1.02 (ref 1.005–1.030)
pH: 5 (ref 5.0–8.0)

## 2024-06-18 LAB — TROPONIN I (HIGH SENSITIVITY)
Troponin I (High Sensitivity): 15 ng/L (ref <18)
Troponin I (High Sensitivity): 16 ng/L (ref <18)

## 2024-06-18 LAB — CBG MONITORING, ED: Glucose-Capillary: 103 mg/dL — ABNORMAL HIGH (ref 70–99)

## 2024-06-18 LAB — CK: Total CK: 526 U/L — ABNORMAL HIGH (ref 49–397)

## 2024-06-18 SURGERY — CREATION, CRANIAL BURR HOLE
Anesthesia: General | Site: Head | Laterality: Left

## 2024-06-18 MED ORDER — PROPOFOL 10 MG/ML IV BOLUS
INTRAVENOUS | Status: DC | PRN
  Administered 2024-06-18: 60 mg via INTRAVENOUS

## 2024-06-18 MED ORDER — ONDANSETRON HCL 4 MG PO TABS: 4.0000 mg | ORAL_TABLET | ORAL | Status: DC | PRN

## 2024-06-18 MED ORDER — POLYETHYLENE GLYCOL 3350 17 G PO PACK: 17.0000 g | PACK | Freq: Every day | ORAL | Status: DC | PRN

## 2024-06-18 MED ORDER — CEFAZOLIN SODIUM-DEXTROSE 2-4 GM/100ML-% IV SOLN
INTRAVENOUS | Status: AC
  Filled 2024-06-18: qty 100

## 2024-06-18 MED ORDER — SUGAMMADEX SODIUM 200 MG/2ML IV SOLN
INTRAVENOUS | Status: DC | PRN
  Administered 2024-06-18: 200 mg via INTRAVENOUS

## 2024-06-18 MED ORDER — THROMBIN 20000 UNITS EX SOLR
CUTANEOUS | Status: AC
  Filled 2024-06-18: qty 20000

## 2024-06-18 MED ORDER — FENTANYL CITRATE (PF) 250 MCG/5ML IJ SOLN
INTRAMUSCULAR | Status: DC | PRN
  Administered 2024-06-18: 75 ug via INTRAVENOUS

## 2024-06-18 MED ORDER — ACETAMINOPHEN 10 MG/ML IV SOLN
INTRAVENOUS | Status: DC | PRN
  Administered 2024-06-18: 1000 mg via INTRAVENOUS

## 2024-06-18 MED ORDER — CHLORHEXIDINE GLUCONATE CLOTH 2 % EX PADS
6.0000 | MEDICATED_PAD | Freq: Every day | CUTANEOUS | Status: DC
  Administered 2024-06-18 – 2024-06-21 (×4): 6 via TOPICAL

## 2024-06-18 MED ORDER — SENNA 8.6 MG PO TABS: 1.0000 | ORAL_TABLET | Freq: Two times a day (BID) | ORAL | Status: DC

## 2024-06-18 MED ORDER — LABETALOL HCL 5 MG/ML IV SOLN: 10.0000 mg | INTRAVENOUS | Status: DC | PRN

## 2024-06-18 MED ORDER — PROMETHAZINE HCL 25 MG PO TABS: 12.5000 mg | ORAL_TABLET | ORAL | Status: DC | PRN

## 2024-06-18 MED ORDER — ACETAMINOPHEN 10 MG/ML IV SOLN
INTRAVENOUS | Status: AC
  Filled 2024-06-18: qty 100

## 2024-06-18 MED ORDER — BACITRACIN ZINC 500 UNIT/GM EX OINT
TOPICAL_OINTMENT | CUTANEOUS | Status: DC | PRN
  Administered 2024-06-18: 1 via TOPICAL

## 2024-06-18 MED ORDER — THROMBIN 5000 UNITS EX KIT
PACK | CUTANEOUS | Status: AC
  Filled 2024-06-18: qty 1

## 2024-06-18 MED ORDER — LIDOCAINE 2% (20 MG/ML) 5 ML SYRINGE
INTRAMUSCULAR | Status: AC
  Filled 2024-06-18: qty 5

## 2024-06-18 MED ORDER — ORAL CARE MOUTH RINSE: 15.0000 mL | OROMUCOSAL | Status: DC | PRN

## 2024-06-18 MED ORDER — HYDROMORPHONE HCL 1 MG/ML IJ SOLN
0.5000 mg | INTRAMUSCULAR | Status: DC | PRN
  Administered 2024-06-19 – 2024-06-20 (×2): 0.5 mg via INTRAVENOUS
  Filled 2024-06-18 (×2): qty 1

## 2024-06-18 MED ORDER — CEFAZOLIN SODIUM-DEXTROSE 2-4 GM/100ML-% IV SOLN
2.0000 g | Freq: Three times a day (TID) | INTRAVENOUS | Status: DC
  Administered 2024-06-19 (×3): 2 g via INTRAVENOUS
  Filled 2024-06-18 (×4): qty 100

## 2024-06-18 MED ORDER — LIDOCAINE-EPINEPHRINE 1 %-1:100000 IJ SOLN
INTRAMUSCULAR | Status: DC | PRN
  Administered 2024-06-18: 2.5 mL via INTRADERMAL

## 2024-06-18 MED ORDER — BUPIVACAINE HCL (PF) 0.25 % IJ SOLN
INTRAMUSCULAR | Status: AC
  Filled 2024-06-18: qty 30

## 2024-06-18 MED ORDER — SUCCINYLCHOLINE CHLORIDE 200 MG/10ML IV SOSY
PREFILLED_SYRINGE | INTRAVENOUS | Status: AC
  Filled 2024-06-18: qty 10

## 2024-06-18 MED ORDER — BACITRACIN ZINC 500 UNIT/GM EX OINT
TOPICAL_OINTMENT | CUTANEOUS | Status: AC
  Filled 2024-06-18: qty 28.35

## 2024-06-18 MED ORDER — ONDANSETRON HCL 4 MG/2ML IJ SOLN
INTRAMUSCULAR | Status: DC | PRN
  Administered 2024-06-18: 4 mg via INTRAVENOUS

## 2024-06-18 MED ORDER — ROCURONIUM BROMIDE 10 MG/ML (PF) SYRINGE
PREFILLED_SYRINGE | INTRAVENOUS | Status: AC
  Filled 2024-06-18: qty 10

## 2024-06-18 MED ORDER — LEVETIRACETAM 500 MG/5ML IV SOLN
INTRAVENOUS | Status: AC
Start: 2024-06-18 — End: 2024-06-18
  Filled 2024-06-18: qty 5

## 2024-06-18 MED ORDER — ONDANSETRON HCL 4 MG/2ML IJ SOLN
INTRAMUSCULAR | Status: AC
  Filled 2024-06-18: qty 2

## 2024-06-18 MED ORDER — DEXAMETHASONE SODIUM PHOSPHATE 10 MG/ML IJ SOLN
INTRAMUSCULAR | Status: AC
  Filled 2024-06-18: qty 1

## 2024-06-18 MED ORDER — PROPOFOL 10 MG/ML IV BOLUS
INTRAVENOUS | Status: AC
Start: 2024-06-18 — End: 2024-06-18
  Filled 2024-06-18: qty 20

## 2024-06-18 MED ORDER — FLEET ENEMA RE ENEM: 1.0000 | ENEMA | Freq: Once | RECTAL | Status: DC | PRN

## 2024-06-18 MED ORDER — SUCCINYLCHOLINE CHLORIDE 200 MG/10ML IV SOSY
PREFILLED_SYRINGE | INTRAVENOUS | Status: DC | PRN
  Administered 2024-06-18: 140 mg via INTRAVENOUS

## 2024-06-18 MED ORDER — PANTOPRAZOLE SODIUM 40 MG IV SOLR
40.0000 mg | Freq: Every day | INTRAVENOUS | Status: DC
  Administered 2024-06-19 – 2024-06-20 (×3): 40 mg via INTRAVENOUS
  Filled 2024-06-18 (×3): qty 10

## 2024-06-18 MED ORDER — FENTANYL CITRATE (PF) 250 MCG/5ML IJ SOLN
INTRAMUSCULAR | Status: AC
  Filled 2024-06-18: qty 5

## 2024-06-18 MED ORDER — CEFAZOLIN SODIUM-DEXTROSE 2-3 GM-%(50ML) IV SOLR
INTRAVENOUS | Status: DC | PRN
  Administered 2024-06-18: 2 g via INTRAVENOUS

## 2024-06-18 MED ORDER — PHENYLEPHRINE 80 MCG/ML (10ML) SYRINGE FOR IV PUSH (FOR BLOOD PRESSURE SUPPORT)
PREFILLED_SYRINGE | INTRAVENOUS | Status: AC
  Filled 2024-06-18: qty 10

## 2024-06-18 MED ORDER — ONDANSETRON HCL 4 MG/2ML IJ SOLN: 4.0000 mg | INTRAMUSCULAR | Status: DC | PRN

## 2024-06-18 MED ORDER — THROMBIN 20000 UNITS EX SOLR: CUTANEOUS | Status: DC | PRN

## 2024-06-18 MED ORDER — LIDOCAINE-EPINEPHRINE 1 %-1:100000 IJ SOLN
INTRAMUSCULAR | Status: AC
  Filled 2024-06-18: qty 1

## 2024-06-18 MED ORDER — LEVETIRACETAM (KEPPRA) 500 MG/5 ML ADULT IV PUSH
500.0000 mg | Freq: Two times a day (BID) | INTRAVENOUS | Status: DC
  Administered 2024-06-19 (×2): 500 mg via INTRAVENOUS
  Filled 2024-06-18 (×2): qty 5

## 2024-06-18 MED ORDER — BUPIVACAINE HCL (PF) 0.25 % IJ SOLN
INTRAMUSCULAR | Status: DC | PRN
  Administered 2024-06-18: 2.5 mL

## 2024-06-18 MED ORDER — DOCUSATE SODIUM 100 MG PO CAPS: 100.0000 mg | ORAL_CAPSULE | Freq: Two times a day (BID) | ORAL | Status: DC

## 2024-06-18 MED ORDER — PHENYLEPHRINE 80 MCG/ML (10ML) SYRINGE FOR IV PUSH (FOR BLOOD PRESSURE SUPPORT)
PREFILLED_SYRINGE | INTRAVENOUS | Status: DC | PRN
  Administered 2024-06-18: 240 ug via INTRAVENOUS

## 2024-06-18 MED ORDER — BISACODYL 10 MG RE SUPP: 10.0000 mg | Freq: Every day | RECTAL | Status: DC | PRN

## 2024-06-18 MED ORDER — PHENYLEPHRINE HCL-NACL 20-0.9 MG/250ML-% IV SOLN
INTRAVENOUS | Status: DC | PRN
  Administered 2024-06-18: 40 ug/min via INTRAVENOUS

## 2024-06-18 MED ORDER — LEVETIRACETAM 500 MG/5ML IV SOLN
INTRAVENOUS | Status: DC | PRN
  Administered 2024-06-18: 500 mg via INTRAVENOUS

## 2024-06-18 MED ORDER — LACTATED RINGERS IV SOLN: INTRAVENOUS | Status: DC | PRN

## 2024-06-18 MED ORDER — 0.9 % SODIUM CHLORIDE (POUR BTL) OPTIME
TOPICAL | Status: DC | PRN
  Administered 2024-06-18: 1000 mL

## 2024-06-18 MED ORDER — DEXAMETHASONE SODIUM PHOSPHATE 10 MG/ML IJ SOLN
INTRAMUSCULAR | Status: DC | PRN
  Administered 2024-06-18: 10 mg via INTRAVENOUS

## 2024-06-18 MED ORDER — ROCURONIUM BROMIDE 10 MG/ML (PF) SYRINGE
PREFILLED_SYRINGE | INTRAVENOUS | Status: DC | PRN
  Administered 2024-06-18: 50 mg via INTRAVENOUS

## 2024-06-18 SURGICAL SUPPLY — 52 items
BAG COUNTER SPONGE SURGICOUNT (BAG) ×1 IMPLANT
BNDG GAUZE DERMACEA FLUFF 4 (GAUZE/BANDAGES/DRESSINGS) ×1 IMPLANT
BUR ACORN 6.0 (BURR) ×1 IMPLANT
BUR SPIRAL ROUTER 2.3 (BUR) IMPLANT
CABLE BIPOLOR RESECTION CORD (MISCELLANEOUS) ×1 IMPLANT
CANISTER SUCTION 3000ML PPV (SUCTIONS) ×1 IMPLANT
CLIP TI MEDIUM 6 (CLIP) IMPLANT
DRAIN CHANNEL 10M FLAT 3/4 FLT (DRAIN) IMPLANT
DRAIN PENROSE .5X12 LATEX STL (DRAIN) IMPLANT
DRAPE WARM FLUID 44X44 (DRAPES) ×1 IMPLANT
DRSG ADAPTIC 3X8 NADH LF (GAUZE/BANDAGES/DRESSINGS) IMPLANT
DURAPREP 6ML APPLICATOR 50/CS (WOUND CARE) ×1 IMPLANT
ELECT CAUTERY BLADE 6.4 (BLADE) ×1 IMPLANT
ELECTRODE REM PT RTRN 9FT ADLT (ELECTROSURGICAL) ×1 IMPLANT
EVACUATOR SILICONE 100CC (DRAIN) IMPLANT
GAUZE 4X4 16PLY ~~LOC~~+RFID DBL (SPONGE) IMPLANT
GAUZE PAD ABD 8X10 STRL (GAUZE/BANDAGES/DRESSINGS) IMPLANT
GAUZE SPONGE 4X4 12PLY STRL (GAUZE/BANDAGES/DRESSINGS) IMPLANT
GLOVE BIOGEL PI IND STRL 8.5 (GLOVE) ×1 IMPLANT
GLOVE ECLIPSE 8.5 STRL (GLOVE) ×1 IMPLANT
GLOVE EXAM NITRILE XL STR (GLOVE) IMPLANT
GOWN STRL REUS W/ TWL LRG LVL3 (GOWN DISPOSABLE) IMPLANT
GOWN STRL REUS W/ TWL XL LVL3 (GOWN DISPOSABLE) ×1 IMPLANT
GOWN STRL REUS W/TWL 2XL LVL3 (GOWN DISPOSABLE) ×1 IMPLANT
HEMOSTAT SURGICEL 2X14 (HEMOSTASIS) IMPLANT
HOOK DURA (MISCELLANEOUS) ×1 IMPLANT
KIT BASIN OR (CUSTOM PROCEDURE TRAY) ×1 IMPLANT
KIT TURNOVER KIT B (KITS) ×1 IMPLANT
NDL HYPO 22X1.5 SAFETY MO (MISCELLANEOUS) ×1 IMPLANT
NEEDLE HYPO 22X1.5 SAFETY MO (MISCELLANEOUS) ×1 IMPLANT
NS IRRIG 1000ML POUR BTL (IV SOLUTION) ×1 IMPLANT
PACK CRANIOTOMY CUSTOM (CUSTOM PROCEDURE TRAY) ×1 IMPLANT
PATTIES SURGICAL .5 X3 (DISPOSABLE) IMPLANT
PIN MAYFIELD SKULL DISP (PIN) IMPLANT
SPECIMEN JAR SMALL (MISCELLANEOUS) IMPLANT
SPIKE FLUID TRANSFER (MISCELLANEOUS) ×1 IMPLANT
SPONGE NEURO XRAY DETECT 1X3 (DISPOSABLE) IMPLANT
SPONGE SURGIFOAM ABS GEL 100 (HEMOSTASIS) IMPLANT
STAPLER SKIN PROX 35W (STAPLE) ×1 IMPLANT
SUT 3-0 BLK 1X30 PSL (SUTURE) IMPLANT
SUT NURALON 4 0 TR CR/8 (SUTURE) ×2 IMPLANT
SUT VIC AB 2-0 CP2 18 (SUTURE) ×2 IMPLANT
SUT VIC AB 4-0 RB1 18 (SUTURE) ×1 IMPLANT
SYR CONTROL 10ML LL (SYRINGE) ×1 IMPLANT
SYSTEM LIMITORR VOL LMT 20ML (CATHETERS) IMPLANT
TAPE CLOTH 1X10 TAN NS (GAUZE/BANDAGES/DRESSINGS) IMPLANT
TAPE CLOTH SURG 4X10 WHT LF (GAUZE/BANDAGES/DRESSINGS) IMPLANT
TOWEL GREEN STERILE (TOWEL DISPOSABLE) ×1 IMPLANT
TOWEL GREEN STERILE FF (TOWEL DISPOSABLE) ×1 IMPLANT
TRAY FOLEY MTR SLVR 16FR STAT (SET/KITS/TRAYS/PACK) IMPLANT
UNDERPAD 30X36 HEAVY ABSORB (UNDERPADS AND DIAPERS) IMPLANT
WATER STERILE IRR 1000ML POUR (IV SOLUTION) ×1 IMPLANT

## 2024-06-18 NOTE — ED Provider Notes (Signed)
  EMERGENCY DEPARTMENT AT [facility] Provider Note   CSN: [id number] Arrival date & time: 06/18/24  1725     History Chief Complaint  Patient presents with   Altered Mental Status    HPI: Kenneth Roy is a 89 y.o. male with history pertinent for osteoarthritis, rheumatoid arthritis, GERD, HTN, former smoker who presents complaining of AMS/found down. Patient arrived via EMS from home.  History provided by EMS.  No interpreter required during this encounter.  Per EMS, patient was found down by his son at the patient's home where he lives alone.  Reports that patient's last known normal was on 8/31 when they spoke on the phone and patient was in his normal state of health.  Reports that he had not attempted to contact the patient between now and then.  Patient reportedly had an appointment this morning to have annual blood work performed, and did not attend the appointment.  Someone over to the patient's house and found him lying on the floor next to a table that had been overturned.  EMS was called.  With EMS the patient has been nonverbal, however hemodynamically stable and route.  Per son the clothing that the patient was wearing was typical of what he would wear when he had an appointment outside of the house, therefore son believes that patient got up this morning, got dressed for his appointment and did not make it out of the house.  EMS notes that patient has a skin tear to the right foot and right upper arm and is not utilizing his left upper extremity is much as usual.  Patient's recorded medical, surgical, social, medication list and allergies were reviewed in the Snapshot window as part of the initial history.   Prior to Admission medications   Medication Sig Start Date End Date Taking? Authorizing Provider  cholecalciferol  (VITAMIN D3) 25 MCG (1000 UNIT) tablet Take 1,000 Units by mouth daily.    [provider]  lisinopril  (ZESTRIL ) 20 MG tablet Take 20  mg by mouth daily. 01/11/21   [provider]  Multiple Vitamins-Minerals (CENTRUM SILVER PO) Take 1 tablet by mouth daily.    [provider]  sulfaSALAzine  (AZULFIDINE ) 500 MG tablet Take 1,000 mg by mouth 2 (two) times daily.    [provider]     Allergies: Patient has no known allergies.   Review of Systems   ROS as per HPI  Physical Exam Updated Vital Signs BP (!) 114/51   Pulse 60   Temp 99.3 F (37.4 C) (Axillary)   Resp 19   SpO2 97%  Physical Exam Vitals and nursing note reviewed.  Constitutional:      General: He is not in acute distress.    Appearance: Normal appearance.  HENT:     Head: Normocephalic and atraumatic.  Eyes:     Extraocular Movements: Extraocular movements intact.  Cardiovascular:     Rate and Rhythm: Normal rate.     Pulses: Normal pulses.  Pulmonary:     Effort: Pulmonary effort is normal.  Musculoskeletal:     Comments: Patient resist active and passive range of motion of his right shoulder and right elbow  Skin:    General: Skin is warm and dry.     Capillary Refill: Capillary refill takes less than 2 seconds.     Findings: Lesion (Skin tear to the left upper arm and right toes) present.  Neurological:     General: No focal deficit present.  Mental Status: He is alert and oriented to person, place, and time.     ED Course/ Medical Decision Making/ A&P Clinical Course as of 06/19/24 0128  Fri Jun 18, 2024  1838 Acute on chronic sdh with shift  [LS]  1844 Discusses with neurosurgery Dr Colon. [JB]    Clinical Course User Index [JB] Barrett, Warren SAILOR, PA-C [LS] Rogelia Jerilynn RAMAN, MD    Procedures .Critical Care  Performed by: Rogelia Jerilynn RAMAN, MD Authorized by: Rogelia Jerilynn RAMAN, MD   Critical care provider statement:    Critical care time (minutes):  32   Critical care time was exclusive of:  Separately billable procedures and treating other patients and teaching time   Critical care was  necessary to treat or prevent imminent or life-threatening deterioration of the following conditions:  CNS failure or compromise   Critical care was time spent personally by me on the following activities:  Development of treatment plan with patient or surrogate, discussions with consultants, evaluation of patient's response to treatment, examination of patient, ordering and review of laboratory studies, ordering and review of radiographic studies, ordering and performing treatments and interventions, pulse oximetry, re-evaluation of patient's condition and review of old charts   Care discussed with: admitting provider      Medications Ordered in ED Medications  docusate sodium  (COLACE) capsule 100 mg (has no administration in time range)  senna (SENOKOT) tablet 8.6 mg (has no administration in time range)  polyethylene glycol (MIRALAX  / GLYCOLAX ) packet 17 g (has no administration in time range)  bisacodyl  (DULCOLAX) suppository 10 mg (has no administration in time range)  sodium phosphate  (FLEET) enema 1 enema (has no administration in time range)  ondansetron  (ZOFRAN ) tablet 4 mg (has no administration in time range)    Or  ondansetron  (ZOFRAN ) injection 4 mg (has no administration in time range)  promethazine  (PHENERGAN ) tablet 12.5-25 mg (has no administration in time range)  labetalol  (NORMODYNE ) injection 10-40 mg (has no administration in time range)  levETIRAcetam  (KEPPRA ) undiluted injection 500 mg (has no administration in time range)  pantoprazole  (PROTONIX ) injection 40 mg (40 mg Intravenous Given 06/19/24 0002)  ceFAZolin  (ANCEF ) IVPB 2g/100 mL premix (has no administration in time range)  HYDROmorphone  (DILAUDID ) injection 0.5 mg (has no administration in time range)  Chlorhexidine  Gluconate Cloth 2 % PADS 6 each (6 each Topical Given 06/18/24 2315)  Oral care mouth rinse (has no administration in time range)    Medical Decision Making:   Kenneth Roy is a 89 y.o. male who  presents for being found down as per above.  Physical exam is pertinent for nonverbal, skin tears to right foot and upper arm, restricted ROM of right shoulder and right elbow.   The differential includes but is not limited to hypoglycemia, metabolic encephalopathy, AKI, rhabdomyolysis.  Independent historian: EMS  External data reviewed: No pertinent external data  Labs: Ordered, Independent interpretation, and Details: Initial troponin 15, delta 16.  CK mildly elevated at 526.  UA without evidence of UTI.  CBC without leukocytosis, anemia, thrombocytopenia.  CMP without AKI, emergent electrolyte derangement, emergent LFT abnormality, glucose WNL  Radiology: Ordered, Independent interpretation, and Details: CT head with large left-sided fluid collection with associated MLS.  CT C-spine without fracture, dislocation, or traumatic malalignment.  Chest x-ray without focal airspace opacification, cardiomediastinal sweat derangement, pneumothorax, pleural effusion, bony derangement.  Pelvic x-ray without pelvic ring disruption, hips located, no displaced fracture.  No displaced fracture or dislocation on plain films of  the right shoulder, elbow, foot DG Shoulder Right Result Date: 06/18/2024 EXAM: 1 VIEW XRAY OF THE SHOULDER 06/18/2024 06:54:00 PM COMPARISON: None available. CLINICAL HISTORY: Fall, AMS, skin tear. Fall, AMS; Only obtainable view of elbow. Due to muscle contraction. FINDINGS: BONES AND JOINTS: High-riding humeral head. Moderate degenerative changes of the acromioclavicular joint. No acute fracture or dislocation. SOFT TISSUES: No abnormal calcifications. Visualized lung is unremarkable. IMPRESSION: 1. No acute fracture or dislocation . 2. High-riding humeral head which can be seen with rotator cuff pathology . Electronically signed by: Norman Gatlin MD 06/18/2024 07:20 PM EDT RP Workstation: [id number]   DG Elbow Complete Right Result Date: 06/18/2024 EXAM: 3 VIEW(S) XRAY OF THE ELBOW  COMPARISON: None available. CLINICAL HISTORY: Fall, AMS, skin tear. Fall, AMS; Only obtainable view of elbow. Due to muscle contraction. FINDINGS: BONES AND JOINTS: No acute fracture. No focal osseous lesion. No joint dislocation. No joint effusion. SOFT TISSUES: Soft tissue swelling in the posterior elbow. IMPRESSION: 1. No evidence of acute fracture on single lateral view of the elbow . 2. Soft tissue swelling in the posterior elbow. Electronically signed by: Norman Gatlin MD 06/18/2024 07:09 PM EDT RP Workstation: [id number]   DG Pelvis Portable Result Date: 06/18/2024 EXAM: 1 OR 2 VIEW(S) XRAY OF THE PELVIS 06/18/2024 06:54:00 PM COMPARISON: None available. CLINICAL HISTORY: Fall, AMS, skin tear. Fall, AMS; Only obtainable view of elbow. Due to muscle contraction. FINDINGS: BONES AND JOINTS: Question nondisplaced right subcapital femoral neck fracture. Left total hip arthroplasty without hardware loosening. SOFT TISSUES: The soft tissues are unremarkable. IMPRESSION: 1. Question nondisplaced right subcapital femoral neck fracture. CT is recommended for further evaluation. Electronically signed by: Norman Gatlin MD 06/18/2024 07:08 PM EDT RP Workstation: [id number]   DG Foot 2 Views Right Result Date: 06/18/2024 EXAM: 1 VIEW XRAY OF THE RIGHT FOOT 06/18/2024 06:54:00 PM COMPARISON: None available. CLINICAL HISTORY: Fall, AMS, skin tear. Fall, AMS; Only obtainable view of elbow. Due to muscle contraction. FINDINGS: BONES AND JOINTS: Demineralization. Osteoarthritis in the midfoot and IP joints. Advanced osteoarthritis of the tibiotalar and subtalar joints. There is collapse of the posterior talus. No acute fracture or dislocation. SOFT TISSUES: The soft tissues are unremarkable. IMPRESSION: 1. No acute fracture or dislocation. 2. Advanced osteoarthritis of the tibiotalar and subtalar joints with posterior collapse of the talus . Electronically signed by: Norman Gatlin MD 06/18/2024 07:05 PM EDT RP  Workstation: [id number]   DG Chest Portable 1 View Result Date: 06/18/2024 EXAM: 1 VIEW XRAY OF THE CHEST 06/18/2024 06:54:00 PM COMPARISON: None available. CLINICAL HISTORY: Fall, AMS, skin tear. FINDINGS: LUNGS AND PLEURA: No focal pulmonary opacity. No pulmonary edema. No pleural effusion. No pneumothorax. HEART AND MEDIASTINUM: No acute abnormality of the cardiac and mediastinal silhouettes. BONES AND SOFT TISSUES: No acute osseous abnormality. IMPRESSION: 1. No acute process. Electronically signed by: Norman Gatlin MD 06/18/2024 07:01 PM EDT RP Workstation: [id number]   CT Head Wo Contrast Addendum Date: 06/18/2024 ** ADDENDUM #1 ** ADDENDUM: Findings discussed with Dr. Rogelia at 6:38PM on 06/18/24. ---------------------------------------------------- Electronically signed by: Donnice Mania MD 06/18/2024 06:45 PM EDT RP Workstation: [id number]   Result Date: 06/18/2024 ** ORIGINAL REPORT ** EXAM: CT HEAD WITHOUT CONTRAST 06/18/2024 06:16:00 PM TECHNIQUE: CT of the head was performed without the administration of intravenous contrast. Automated exposure control, iterative reconstruction, and/or weight based adjustment of the mA/kV was utilized to reduce the radiation dose to as low as reasonably achievable. COMPARISON: None available. CLINICAL HISTORY: Head trauma, minor (Age >= 65y); Mental status  change, unknown cause. Altered Mental Status; Pt bib [facility] coming from home with CC of AMS. Pt reportedly was last seen normal on Sunday (baseline a\T\ox4, gcs 15) but was found today on floor by family. Pt son reports to EMS that pt did have a doctor's appointment and appeared to be dressed for appointment when they found patient on the floor in the living room. Pt does have pain response to right arm with skin tear to elbow. Pt alert but disoriented x4, unable to follow commands. FINDINGS: BRAIN AND VENTRICLES: There is a mixed attenuation subdural hematoma over the left cerebral convexity which measures up to 18  mm in maximum thickness on coronal images. There is associated mass effect on the parenchyma, particularly along the left frontal lobe and the posterolateral aspect of the left parieto-occipital lobes. The collection is primarily low attenuation with heterogeneous areas of intermediate to high attenuation and possible internal septations. There is 7 mm rightward midline shift. Nonspecific hypoattenuation in the periventricular and subcortical white matter, most likely representing chronic small vessel disease. Remote lacunar infarcts in the bilateral basal ganglia. ORBITS: Bilateral lens replacement. Scleral buckle on the right. There is slight enophthalmos of the right globe. SINUSES: Mild mucosal thickening in the ethmoid sinuses. SOFT TISSUES AND SKULL: Postsurgical changes of right canal wall down mastoidectomy. No evidence of acute fracture. Additional postsurgical changes of the left mastoid temporal bone likely reflecting additional canal wall down mastoidectomy. Thickening of bone along the left orbital roof extending to the orbital apex involving the anterior clinoid process likely reflecting a benign fibroosseous lesion. IMPRESSION: 1. Mixed attenuation subdural hematoma over the left cerebral convexity, measuring up to 18 mm in maximum thickness, with associated mass effect and 7 mm rightward midline shift. 2. Mild chronic small vessel disease. 3. Remote lacunar infarcts in the bilateral basal ganglia. Electronically signed by: Donnice Mania MD 06/18/2024 06:26 PM EDT RP Workstation: [id number]   CT Cervical Spine Wo Contrast Result Date: 06/18/2024 EXAM: CT CERVICAL SPINE WITHOUT CONTRAST 06/18/2024 06:16:00 PM TECHNIQUE: CT of the cervical spine was performed without the administration of intravenous contrast. Multiplanar reformatted images are provided for review. Automated exposure control, iterative reconstruction, and/or weight based adjustment of the mA/kV was utilized to reduce the radiation dose  to as low as reasonably achievable. COMPARISON: None available. CLINICAL HISTORY: Neck trauma (Age >= 65y). Altered Mental Status; CT Head Wo Contrast; Head trauma, minor (Age >= 65y), Mental status change, unknown cause; CT Cervical Spine Wo Contrast; Neck trauma (Age >= 65y); See ED Notes:; Pt bib [facility] coming from home with CC of AMS. Pt reportedly was last seen normal on Sunday (baseline a\T\ox4, gcs 15) but was found today on floor by family. Pt son reports to EMS that pt did have a doctor's appointment and appeared to be dressed for appointment when they found patient on the floor in the living room. Pt does have pain response to right arm with skin tear to elbow. Pt alert but disoriented x4, unable to follow commands. FINDINGS: CERVICAL SPINE: BONES AND ALIGNMENT: Cervical lordosis is maintained. Trace anterolisthesis of C7 on T1. No facet subluxation or dislocation. No compression fracture or displaced fracture. DEGENERATIVE CHANGES: Degenerative endplate osteophytes at multiple levels with intervertebral disc space narrowing, most pronounced at C6-7. Degenerative changes of the dens and at the atlantodens articulation. Disc osteophyte complexes at multiple levels. Mild spinal canal stenosis at C5-6. Facet arthrosis and uncovertebral hypertrophy at multiple levels. There is significant foraminal narrowing at multiple levels. SOFT  TISSUES: Subcentimeter nodules in the right thyroid lobe. IMPRESSION: 1. No acute abnormality of the cervical spine related to the reported neck trauma. 2. Degenerative changes as above. Electronically signed by: Donnice Mania MD 06/18/2024 06:34 PM EDT RP Workstation: [id number]    EKG/Medicine tests: Ordered and Independent interpretation EKG Interpretation: Sinus rhythm Probable left atrial enlargement Inferior infarct, old Anteroseptal infarct, age indeterminate Nonspecific T wave inversions are similar on comparison to prior Confirmed by Rogelia Satterfield ([id number]) on 06/18/2024  6:10:52 PM                Interventions:none   See the EMR for full details regarding lab and imaging results.  Patient presents for altered mental status after being found down.  Patient nonverbal on exam which is a change from baseline, also has evidence of pain and skin tears on his right hemibody, therefore broad labs and imaging are indicated.  CT head and CT C-spine obtained as per the [place] CT head and CT C-spine rule as well as broad-spectrum labs.  Labs are reassuring, however CT head does demonstrate large subdural collection with evidence of midline shift.  Given patient's mental status change, concerning that subdural is underlying etiology of symptoms.  No evidence of anticoagulants on review of chart, therefore vitamin K, Kcentra, etc. not indicated.  Neurosurgery consulted, and evaluated patient at bedside, they feel that patient is most appropriate for emergent operative intervention and admission thereafter.  Presentation is most consistent with acute life/limb-threatening illness  Discussion of management or test interpretations with external provider(s): Dr. Mania, radiology, Dr. Colon, neurosurgery  Risk Drugs:None Treatment: Decision regarding hospitalization Surgery:Emergency major surgery Critical Care: 32 minutes  Disposition: OR: Patient proceeded to the OR with neurosurgery for operative intervention.  MDM generated using voice dictation software and may contain dictation errors.  Please contact me for any clarification or with any questions.  Clinical Impression:  1. SDH (subdural hematoma) (HCC)      Admit   Final Clinical Impression(s) / ED Diagnoses Final diagnoses:  SDH (subdural hematoma) (HCC)    Rx / DC Orders ED Discharge Orders     None        Rogelia Satterfield RAMAN, MD 06/19/24 0128

## 2024-06-18 NOTE — Op Note (Signed)
 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 Postoperative diagnosis: Same Procedure: Bur hole drainage of left chronic subdural hematoma Surgeon: Victory Gens Anesthesia: General endotracheal Indications: Kenneth Roy is a 89 year old individual whose had a chronic subdural hematoma.  It recently altered his mental status such that he could not speak and he has become increasingly agitated subdural demonstrates 7 mm midline shift he has been advised regarding the need for surgical drainage.  Procedure: The patient was brought to the operating room supine on the stretcher.  After the smooth induction of general endotracheal anesthesia his head was shaved cleansed with alcohol  and prepped with DuraPrep.  Left side was marked for an anterior Barse bur hole and a left parietal Barse bur hole skin was infiltrated with lidocaine  with epinephrine .  A bur hole was then created using high-speed drill.  The dura was identified.  Hemostasis around the bur hole was obtained using some bone wax in addition to Gelfoam soaked in thrombin .  The dura was then opened in a cruciate fashion and the underlying dural membrane was pierced identifying a substantial egress of chronic subdural bloody fluid.  From both holes a different texture of effluent was noted and after some probing with the probe it appears that the 2 apartments of the subdural were opened together.  Then we could irrigate from the inferior hole and had egress of fluid from the superior hole.  In the end we left a ventriculostomy catheter in the subdural space and this was brought out through the posterior hole.  Area was irrigated copiously until all the effluent was noted to be quite clear.  Then the galea was closed with 2-0 Vicryl in interrupted fashion and the inferior and the superior holes.  Surgical staples were placed on the skin.  The ventricular drain was brought out through  a separate stab incision and connected to a gravity drainage system.  A dry sterile dressing was applied to the patient's scalp.  Blood loss for the procedure was nil.

## 2024-06-18 NOTE — Transfer of Care (Signed)
 Immediate Anesthesia Transfer of Care Note  Patient: Kenneth Roy  Procedure(s) Performed: CREATION, CRANIAL BURR HOLE (Left: Head)  Patient Location: PACU  Anesthesia Type:General  Level of Consciousness: awake  Airway & Oxygen Therapy: Patient Spontanous Breathing and Patient connected to nasal cannula oxygen  Post-op Assessment: Report given to RN and Post -op Vital signs reviewed and stable  Post vital signs: Reviewed and stable  Last Vitals:  Vitals Value Taken Time  BP 143/68 06/18/24 22:30  Temp 37.7 C 06/18/24 22:25  Pulse 75 06/18/24 22:32  Resp 25 06/18/24 22:32  SpO2 100 % 06/18/24 22:32  Vitals shown include unfiled device data.  Last Pain:  Vitals:   06/18/24 1737  TempSrc: Oral         Complications: No notable events documented.

## 2024-06-18 NOTE — ED Notes (Signed)
 ED Provider at bedside.

## 2024-06-18 NOTE — Anesthesia Preprocedure Evaluation (Addendum)
 Anesthesia Evaluation  Patient identified by MRN, date of birth, ID band Patient confused    Reviewed: Allergy & Precautions, Patient's Chart, lab work & pertinent test results, Unable to perform ROS - Chart review only  History of Anesthesia Complications Negative for: history of anesthetic complications  Airway Mallampati: Unable to assess       Dental  (+) Dental Advisory Given   Pulmonary former smoker   + rhonchi        Cardiovascular hypertension, Pt. on medications  Rhythm:Regular     Neuro/Psych LEFT SIDE SDH    GI/Hepatic ,GERD  ,,  Endo/Other  negative endocrine ROS    Renal/GU Lab Results      Component                Value               Date                      NA                       132 (L)             06/18/2024                K                        3.5                 06/18/2024                CO2                      19 (L)              06/18/2024                GLUCOSE                  100 (H)             06/18/2024                BUN                      10                  06/18/2024                CREATININE               0.50 (L)            06/18/2024                CALCIUM                  7.7 (L)             06/18/2024                GFRNONAA                 >60                 06/18/2024                Musculoskeletal  (+) Arthritis ,    Abdominal   Peds  Hematology negative hematology ROS (+)  Lab Results      Component                Value               Date                      WBC                      7.6                 06/18/2024                HGB                      14.3                06/18/2024                HCT                      42.0                06/18/2024                MCV                      100.7 (H)           06/18/2024                PLT                      184                 06/18/2024              Anesthesia Other Findings    Reproductive/Obstetrics                              Anesthesia Physical Anesthesia Plan  ASA: 4  Anesthesia Plan: General   Post-op Pain Management: Ofirmev  IV (intra-op)*   Induction: Intravenous  PONV Risk Score and Plan: 2 and Ondansetron  and Dexamethasone   Airway Management Planned: Oral ETT  Additional Equipment: None  Intra-op Plan:   Post-operative Plan: Possible Post-op intubation/ventilation  Informed Consent:      Consent reviewed with POA and History available from chart only  Plan Discussed with: CRNA  Anesthesia Plan Comments:          Anesthesia Quick Evaluation

## 2024-06-18 NOTE — ED Notes (Signed)
 Bedside report given to [place], Scientist, clinical (histocompatibility and immunogenetics). Pt being transported by CRNA/OR team to OR.

## 2024-06-18 NOTE — Anesthesia Procedure Notes (Signed)
 Procedure Name: Intubation Date/Time: 06/18/2024 9:03 PM  Performed by: Roddie Grate, CRNAPre-anesthesia Checklist: Patient identified, Emergency Drugs available, Suction available, Patient being monitored and Timeout performed Patient Re-evaluated:Patient Re-evaluated prior to induction Oxygen Delivery Method: Circle system utilized Preoxygenation: Pre-oxygenation with 100% oxygen Induction Type: IV induction, Rapid sequence and Cricoid Pressure applied Laryngoscope Size: Mac and 4 Grade View: Grade I Tube type: Oral Tube size: 7.0 mm Number of attempts: 1 Airway Equipment and Method: Stylet Placement Confirmation: ETT inserted through vocal cords under direct vision, positive ETCO2 and breath sounds checked- equal and bilateral Secured at: 23 cm Tube secured with: Tape Dental Injury: Teeth and Oropharynx as per pre-operative assessment  Comments: Smooth IV Induction. Eyes taped. RSI Performed. DL x 1 with grade 1 view. Atraumatically placed, teeth and lip remain intact as pre-op. Secured with tape. Bilateral breath sounds +/=, EtCO2 +, Adequate TV, VSS.

## 2024-06-18 NOTE — H&P (Signed)
 Kenneth Roy is an 89 y.o. male.   Chief Complaint: Unable to speak and confusion HPI: Patient is a 89 year old individual whose found by his Kenneth Roy today to be somewhat confused and unable to speak.  Seems to be mildly agitated.  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  At the current time he is mute.  Kenneth Roy notes that he [place] talked to him earlier in the week and he was completely lucid and getting around however he believes that over the last 48 hours he has deteriorated since his last visit.  He does not have any external signs of trauma.  CT scan today demonstrates presence of a large chronic subdural hematoma with some layering.  There is left-to-right shift.  Past Medical History:  Diagnosis Date   GERD (gastroesophageal reflux disease)    Hypertension    Osteoarthritis    Rheumatoid arthritis (HCC)     Past Surgical History:  Procedure Laterality Date   ANKLE SURGERY Left    EYE SURGERY     INNER EAR SURGERY Bilateral    JOINT REPLACEMENT     REPLACEMENT TOTAL KNEE Right    RETINAL DETACHMENT SURGERY Right    SYNOVECTOMY Right 07/24/2021   Procedure: SYNOVECTOMY;  Surgeon: Camella Fallow, MD;  Location: [facility];  Service: Orthopedics;  Laterality: Right;   TENDON REPAIR Right 07/24/2021   Procedure: TENDON REPAIR OF WRIST, TENDON SYNOVECTOMY AND TENDON TRANSFER AND RECONSTRUCTION,SECONDARY TO MULTIPLE RUPTURES,AND POSSIBLE DISTAL RADIAL ULNAR JOINT RESECTION WITH TENDON TRANSFER;  Surgeon: Camella Fallow, MD;  Location: [facility];  Service: Orthopedics;  Laterality: Right;  BLOCK WITH IV SEDATION   TENDON TRANSFER Right 07/24/2021   Procedure: TENDON TRANSFER;  Surgeon: Camella Fallow, MD;  Location: [facility];  Service: Orthopedics;  Laterality: Right;   TONSILLECTOMY     TOTAL HIP ARTHROPLASTY Left 02/26/2021   Procedure: TOTAL  HIP ARTHROPLASTY ANTERIOR APPROACH;  Surgeon: Melodi Lerner, MD;  Location: [facility];  Service: Orthopedics;  Laterality: Left;    WRIST SURGERY      History reviewed. No pertinent family history. Social History:  reports that he has quit smoking. He has never used smokeless tobacco. He reports current alcohol  use. He reports that he does not use drugs.  Allergies: No Known Allergies  (Not in a hospital admission)   Results for orders placed or performed during the hospital encounter of 06/18/24 (from the past 48 hours)  CBG monitoring, ED     Status: Abnormal   Collection Time: 06/18/24  5:44 PM  Result Value Ref Range   Glucose-Capillary 103 (H) 70 - 99 mg/dL    Comment: Glucose reference range applies only to samples taken after fasting for at least 8 hours.   Comment 1 Document in Chart   Urinalysis, w/ Reflex to Culture (Infection Suspected) -Urine, Clean Catch     Status: Abnormal   Collection Time: 06/18/24  6:19 PM  Result Value Ref Range   Specimen Source URINE, CLEAN CATCH    Color, Urine AMBER (A) YELLOW    Comment: BIOCHEMICALS MAY BE AFFECTED BY COLOR   APPearance CLEAR CLEAR   Specific Gravity, Urine 1.021 1.005 - 1.030   pH 5.0 5.0 - 8.0   Glucose, UA NEGATIVE NEGATIVE mg/dL   Hgb urine dipstick MODERATE (A) NEGATIVE   Bilirubin Urine NEGATIVE NEGATIVE  Ketones, ur 80 (A) NEGATIVE mg/dL   Protein, ur 899 (A) NEGATIVE mg/dL   Nitrite NEGATIVE NEGATIVE   Leukocytes,Ua NEGATIVE NEGATIVE   RBC / HPF 0-5 0 - 5 RBC/hpf   WBC, UA 0-5 0 - 5 WBC/hpf    Comment:        Reflex urine culture not performed if WBC <=10, OR if Squamous epithelial cells >5. If Squamous epithelial cells >5 suggest recollection.    Bacteria, UA RARE (A) NONE SEEN   Squamous Epithelial / HPF 0-5 0 - 5 /HPF   Mucus PRESENT     Comment: Performed at [facility], [address] [place], [place] [address]  I-stat chem 8, ED (not at [facility], [facility] or [facility])     Status: Abnormal    Collection Time: 06/18/24  7:25 PM  Result Value Ref Range   Sodium 132 (L) 135 - 145 mmol/L   Potassium 3.5 3.5 - 5.1 mmol/L   Chloride 98 98 - 111 mmol/L   BUN 10 8 - 23 mg/dL   Creatinine, Ser 9.49 (L) 0.61 - 1.24 mg/dL   Glucose, Bld 899 (H) 70 - 99 mg/dL    Comment: Glucose reference range applies only to samples taken after fasting for at least 8 hours.   Calcium, Ion 0.98 (L) 1.15 - 1.40 mmol/L   TCO2 19 (L) 22 - 32 mmol/L   Hemoglobin 14.3 13.0 - 17.0 g/dL   HCT 57.9 60.9 - 47.9 %     Review of Systems  Unable to perform ROS: Acuity of condition    Blood pressure (!) 168/108, pulse 94, temperature 98.7 F (37.1 C), temperature source Oral, resp. rate (!) 22, SpO2 100%. Physical Exam Constitutional:      Appearance: Normal appearance.  HENT:     Head: Normocephalic and atraumatic.     Right Ear: Tympanic membrane, ear canal and external ear normal.     Left Ear: Tympanic membrane, ear canal and external ear normal.     Nose: Nose normal.     Mouth/Throat:     Mouth: Mucous membranes are moist.     Pharynx: Oropharynx is clear.  Eyes:     Conjunctiva/sclera: Conjunctivae normal.     Pupils: Pupils are equal, round, and reactive to light.  Cardiovascular:     Pulses: Normal pulses.     Heart sounds: Normal heart sounds.  Pulmonary:     Effort: Pulmonary effort is normal.     Breath sounds: Normal breath sounds.  Abdominal:     General: Abdomen is flat.     Palpations: Abdomen is soft.  Musculoskeletal:     Cervical back: Normal range of motion and neck supple.  Skin:    Capillary Refill: Capillary refill takes less than 2 seconds.  Neurological:     Mental Status: He is alert.     Comments: Patient is alert and awake but nonconversant not following commands.  He is moving all 4 extremities however he is picking at his lines he is picking at his oxygen sensor and at his sheets in a semi purposeful fashion.      Assessment/Plan Chronic subdural hematoma on  the left with significant mass effect and left-to-right shift.  Plan: Decompression of left-sided subdural hematoma with bur hole drainage.  Victory JINNY Gens, MD 06/18/2024, 7:40 PM

## 2024-06-18 NOTE — ED Triage Notes (Signed)
 Pt bib [facility] coming from home with CC of AMS. Pt reportedly was last seen normal on Sunday (baseline a&ox4, gcs 15) but was found today on floor by family. Pt son reports to EMS that pt did have a doctor's appointment and appeared to be dressed for appointment when they found patient on the floor in the living room. Pt does have pain response to right arm with skin tear to elbow. Pt alert but disoriented x4, unable to follow commands.   EMS VS: 168/80 99% RA 104 cbg 86 HR 18 RR

## 2024-06-19 ENCOUNTER — Inpatient Hospital Stay (HOSPITAL_COMMUNITY)

## 2024-06-19 ENCOUNTER — Encounter (HOSPITAL_COMMUNITY)
Admission: EM | Disposition: A | Discharge status: Hospice | Payer: Self-pay | Source: Home / Self Care | Attending: Neurological Surgery

## 2024-06-19 DIAGNOSIS — I10 Essential (primary) hypertension: Secondary | ICD-10-CM

## 2024-06-19 DIAGNOSIS — L8990 Pressure ulcer of unspecified site, unspecified stage: Secondary | ICD-10-CM | POA: Insufficient documentation

## 2024-06-19 DIAGNOSIS — S065XAA Traumatic subdural hemorrhage with loss of consciousness status unknown, initial encounter: Secondary | ICD-10-CM | POA: Diagnosis present

## 2024-06-19 DIAGNOSIS — I6200 Nontraumatic subdural hemorrhage, unspecified: Secondary | ICD-10-CM | POA: Diagnosis not present

## 2024-06-19 DIAGNOSIS — K219 Gastro-esophageal reflux disease without esophagitis: Secondary | ICD-10-CM | POA: Diagnosis not present

## 2024-06-19 LAB — COMPREHENSIVE METABOLIC PANEL WITH GFR
ALT: 19 U/L (ref 0–44)
AST: 28 U/L (ref 15–41)
Albumin: 2.5 g/dL — ABNORMAL LOW (ref 3.5–5.0)
Alkaline Phosphatase: 50 U/L (ref 38–126)
Anion gap: 12 (ref 5–15)
BUN: 10 mg/dL (ref 8–23)
CO2: 20 mmol/L — ABNORMAL LOW (ref 22–32)
Calcium: 7.9 mg/dL — ABNORMAL LOW (ref 8.9–10.3)
Chloride: 99 mmol/L (ref 98–111)
Creatinine, Ser: 0.82 mg/dL (ref 0.61–1.24)
GFR, Estimated: >60 mL/min (ref >60)
Glucose, Bld: 152 mg/dL — ABNORMAL HIGH (ref 70–99)
Potassium: 3.5 mmol/L (ref 3.5–5.1)
Sodium: 131 mmol/L — ABNORMAL LOW (ref 135–145)
Total Bilirubin: 1.6 mg/dL — ABNORMAL HIGH (ref 0.0–1.2)
Total Protein: 6.1 g/dL — ABNORMAL LOW (ref 6.5–8.1)

## 2024-06-19 LAB — GLUCOSE, CAPILLARY
Glucose-Capillary: 108 mg/dL — ABNORMAL HIGH (ref 70–99)
Glucose-Capillary: 108 mg/dL — ABNORMAL HIGH (ref 70–99)
Glucose-Capillary: 132 mg/dL — ABNORMAL HIGH (ref 70–99)
Glucose-Capillary: 136 mg/dL — ABNORMAL HIGH (ref 70–99)
Glucose-Capillary: 142 mg/dL — ABNORMAL HIGH (ref 70–99)
Glucose-Capillary: 83 mg/dL (ref 70–99)

## 2024-06-19 LAB — CBC
HCT: 37.9 % — ABNORMAL LOW (ref 39.0–52.0)
Hemoglobin: 13.1 g/dL (ref 13.0–17.0)
MCH: 34.3 pg — ABNORMAL HIGH (ref 26.0–34.0)
MCHC: 34.6 g/dL (ref 30.0–36.0)
MCV: 99.2 fL (ref 80.0–100.0)
Platelets: 191 K/uL (ref 150–400)
RBC: 3.82 MIL/uL — ABNORMAL LOW (ref 4.22–5.81)
RDW: 14 % (ref 11.5–15.5)
WBC: 6.3 K/uL (ref 4.0–10.5)
nRBC: 0 % (ref 0.0–0.2)

## 2024-06-19 LAB — MAGNESIUM: Magnesium: 1.8 mg/dL (ref 1.7–2.4)

## 2024-06-19 LAB — MRSA NEXT GEN BY PCR, NASAL: MRSA by PCR Next Gen: NOT DETECTED

## 2024-06-19 LAB — HEMOGLOBIN A1C
Hgb A1c MFr Bld: 4.5 % — ABNORMAL LOW (ref 4.8–5.6)
Mean Plasma Glucose: 82.45 mg/dL

## 2024-06-19 LAB — PHOSPHORUS: Phosphorus: 3 mg/dL (ref 2.5–4.6)

## 2024-06-19 SURGERY — CREATION, CRANIAL BURR HOLE
Anesthesia: General | Laterality: Left

## 2024-06-19 MED ORDER — HALOPERIDOL LACTATE 5 MG/ML IJ SOLN
2.0000 mg | Freq: Four times a day (QID) | INTRAMUSCULAR | Status: DC | PRN
  Administered 2024-06-20: 2 mg via INTRAVENOUS
  Filled 2024-06-19: qty 1

## 2024-06-19 MED ORDER — INSULIN ASPART 100 UNIT/ML IJ SOLN
0.0000 [IU] | INTRAMUSCULAR | Status: DC
  Administered 2024-06-19 (×3): 1 [IU] via SUBCUTANEOUS

## 2024-06-19 MED ORDER — SODIUM CHLORIDE 0.9 % IV SOLN: INTRAVENOUS | Status: AC

## 2024-06-19 MED ORDER — MAGNESIUM SULFATE 2 GM/50ML IV SOLN
2.0000 g | Freq: Once | INTRAVENOUS | Status: AC
  Administered 2024-06-19: 2 g via INTRAVENOUS
  Filled 2024-06-19: qty 50

## 2024-06-19 MED ORDER — DEXTROSE IN LACTATED RINGERS 5 % IV SOLN: INTRAVENOUS | Status: DC

## 2024-06-19 MED ORDER — POTASSIUM CHLORIDE 10 MEQ/100ML IV SOLN
10.0000 meq | INTRAVENOUS | Status: AC
  Administered 2024-06-19 (×2): 10 meq via INTRAVENOUS
  Filled 2024-06-19 (×2): qty 100

## 2024-06-19 NOTE — Progress Notes (Signed)
 eLink Physician-Brief Progress Note Patient Name: Kenneth Roy DOB: Jul 13, 1927 MRN: [id number]   Date of Service  06/19/2024  HPI/Events of Note  Intermittent restless agitation.   eICU Interventions  Haldol  2 mg every 6 hrs as needed for 4 doses     Intervention Category Major Interventions: Delirium, psychosis, severe agitation - evaluation and management  CLAUDENE AGENT, P 06/19/2024, 8:05 PM

## 2024-06-19 NOTE — Anesthesia Postprocedure Evaluation (Signed)
 Anesthesia Post Note  Patient: Kenneth Roy  Procedure(s) Performed: CREATION, CRANIAL BURR HOLE (Left: Head)     Patient location during evaluation: PACU Anesthesia Type: General Level of consciousness: patient cooperative Pain management: pain level controlled Vital Signs Assessment: post-procedure vital signs reviewed and stable Respiratory status: spontaneous breathing, nonlabored ventilation, respiratory function stable and patient connected to nasal cannula oxygen Cardiovascular status: blood pressure returned to baseline and stable Postop Assessment: no apparent nausea or vomiting Anesthetic complications: no   No notable events documented.  Last Vitals:  Vitals:   06/19/24 0000 06/19/24 0100  BP: (!) 125/57 (!) 114/51  Pulse: 66 60  Resp: 20 19  Temp:    SpO2: 98% 97%    Last Pain:  Vitals:   06/18/24 2358  TempSrc: Axillary  PainSc:                  [name]

## 2024-06-19 NOTE — Evaluation (Signed)
 Clinical/Bedside Swallow Evaluation Patient Details  Name: Kenneth Roy MRN: [id number] Date of Birth: 1927/05/23  Today's Date: 06/19/2024 Time: SLP Start Time (ACUTE ONLY): 1029 SLP Stop Time (ACUTE ONLY): 1037 SLP Time Calculation (min) (ACUTE ONLY): 8 min  Past Medical History:  Past Medical History:  Diagnosis Date   GERD (gastroesophageal reflux disease)    Hypertension    Osteoarthritis    Rheumatoid arthritis (HCC)    Past Surgical History:  Past Surgical History:  Procedure Laterality Date   ANKLE SURGERY Left    EYE SURGERY     INNER EAR SURGERY Bilateral    JOINT REPLACEMENT     REPLACEMENT TOTAL KNEE Right    RETINAL DETACHMENT SURGERY Right    SYNOVECTOMY Right 07/24/2021   Procedure: SYNOVECTOMY;  Surgeon: Camella Fallow, MD;  [facility];  Service: Orthopedics;  Laterality: Right;   TENDON REPAIR Right 07/24/2021   Procedure: TENDON REPAIR OF WRIST, TENDON SYNOVECTOMY AND TENDON TRANSFER AND RECONSTRUCTION,SECONDARY TO MULTIPLE RUPTURES,AND POSSIBLE DISTAL RADIAL ULNAR JOINT RESECTION WITH TENDON TRANSFER;  Surgeon: Camella Fallow, MD;  Location: [facility];  Service: Orthopedics;  Laterality: Right;  BLOCK WITH IV SEDATION   TENDON TRANSFER Right 07/24/2021   Procedure: TENDON TRANSFER;  Surgeon: Camella Fallow, MD;  Location: [facility];  Service: Orthopedics;  Laterality: Right;   TONSILLECTOMY     TOTAL HIP ARTHROPLASTY Left 02/26/2021   Procedure: TOTAL HIP ARTHROPLASTY ANTERIOR APPROACH;  Surgeon: Melodi Lerner, MD;  Location: [facility];  Service: Orthopedics;  Laterality: Left;    WRIST SURGERY     HPI:  Patient is a 89 y.o. male with PMH: GERD, HTN, RA/OA, HOH who was found down and altered at home. At baseline, patient lives by himself, drives to church and grocery store and does not need any assistance from family. When EMS arrived, patient found to be aphasic but stable. CT head showed large left-sided fluid collection with associated midline shift;  Remote lacunar infarcts in the bilateral basal ganglia. He underwent surgical  decompression of left-sided SDH with bur hole drainage on 06/18/24. He has been NPO, failed [facility] with RN.    Assessment / Plan / Recommendation  Clinical Impression  Patient is currently presenting with a severe oral dysphagia with impact from cognitive impairment. He readily opened mouth to accept ice chip when presented on spoon, however exhibited no lip rounding or oral motor movement to accept ice chip into oral cavity. SLP placed ice chip in anterior portion of oral cavity and patient exhibited no attempts to manipulate it. He kept mouth open until SLP cued via tactile stimulation, to close it. SLP removed ice chip and suctioned out water which remained in anterior portion of oral cavity. No swallow initiated. SLP recommending continue NPO and will follow for PO readiness. SLP Visit Diagnosis: Dysphagia, oral phase (R13.11)    Aspiration Risk  Severe aspiration risk;Risk for inadequate nutrition/hydration    Diet Recommendation NPO    Medication Administration: Via alternative means    Other  Recommendations Oral Care Recommendations: Oral care QID;Staff/trained caregiver to provide oral care     Assistance Recommended at Discharge Frequent or constant Supervision/Assistance  Functional Status Assessment Patient has had a recent decline in their functional status and demonstrates the ability to make significant improvements in function in a reasonable and predictable amount of time.  Frequency and Duration min 3x week  2 weeks       Prognosis Prognosis for improved oropharyngeal function: Good Barriers to Reach  Goals: Cognitive deficits;Severity of deficits      Swallow Study   General Date of Onset: 06/18/24 HPI: Patient is a 89 y.o. male with PMH: GERD, HTN, RA/OA, HOH who was found down and altered at home. At baseline, patient lives by himself, drives to church and grocery store and does not need any  assistance from family. When EMS arrived, patient found to be aphasic but stable. CT head showed large left-sided fluid collection with associated midline shift; Remote lacunar infarcts in the bilateral basal ganglia. He underwent surgical  decompression of left-sided SDH with bur hole drainage on 06/18/24. He has been NPO, failed [facility] with RN. Type of Study: Bedside Swallow Evaluation Previous Swallow Assessment: none found Diet Prior to this Study: NPO Temperature Spikes Noted: No Respiratory Status: Nasal cannula History of Recent Intubation: Yes Total duration of intubation (days):  (for surgery) Date extubated: 06/18/24 Behavior/Cognition: Alert Oral Cavity Assessment: Within Functional Limits;Dry Oral Care Completed by SLP: Yes Oral Cavity - Dentition: Adequate natural dentition;Other (Comment) (missing all molars on left side, per son patient has a denture plate) Self-Feeding Abilities: Total assist Patient Positioning: Upright in bed Baseline Vocal Quality: Not observed Volitional Cough: Cognitively unable to elicit Volitional Swallow: Unable to elicit    Oral/Motor/Sensory Function Overall Oral Motor/Sensory Function: Mild impairment Facial Symmetry: Abnormal symmetry left Facial Strength: Reduced left   Ice Chips Ice chips: Impaired Oral Phase Impairments: Poor awareness of bolus;Reduced lingual movement/coordination   Thin Liquid Thin Liquid: Not tested    Nectar Thick Nectar Thick Liquid: Not tested   Honey Thick Honey Thick Liquid: Not tested   Puree Puree: Not tested   Solid     Solid: Not tested      Norleen IVAR Blase, MA, CCC-SLP Speech Therapy

## 2024-06-19 NOTE — Evaluation (Signed)
 Speech Language Pathology Evaluation Patient Details Name: Kenneth Roy MRN: [id number] DOB: 03/24/1927 Today's Date: 06/19/2024 Time: 8980-8970 SLP Time Calculation (min) (ACUTE ONLY): 10 min  Problem List:  Patient Active Problem List   Diagnosis Date Noted   Subdural hematoma, post-traumatic (HCC) 06/19/2024   Pressure injury of skin 06/19/2024   Subdural hematoma (HCC) 06/18/2024   Chronic subdural hematoma (HCC) 06/18/2024   Rheumatoid arthritis (HCC) 07/24/2021   OA (osteoarthritis) of hip 02/26/2021   Primary osteoarthritis of left hip 02/26/2021   Past Medical History:  Past Medical History:  Diagnosis Date   GERD (gastroesophageal reflux disease)    Hypertension    Osteoarthritis    Rheumatoid arthritis (HCC)    Past Surgical History:  Past Surgical History:  Procedure Laterality Date   ANKLE SURGERY Left    EYE SURGERY     INNER EAR SURGERY Bilateral    JOINT REPLACEMENT     REPLACEMENT TOTAL KNEE Right    RETINAL DETACHMENT SURGERY Right    SYNOVECTOMY Right 07/24/2021   Procedure: SYNOVECTOMY;  Surgeon: Camella Fallow, MD;  Location: [facility];  Service: Orthopedics;  Laterality: Right;   TENDON REPAIR Right 07/24/2021   Procedure: TENDON REPAIR OF WRIST, TENDON SYNOVECTOMY AND TENDON TRANSFER AND RECONSTRUCTION,SECONDARY TO MULTIPLE RUPTURES,AND POSSIBLE DISTAL RADIAL ULNAR JOINT RESECTION WITH TENDON TRANSFER;  Surgeon: Camella Fallow, MD;  Location: [facility];  Service: Orthopedics;  Laterality: Right;  BLOCK WITH IV SEDATION   TENDON TRANSFER Right 07/24/2021   Procedure: TENDON TRANSFER;  Surgeon: Camella Fallow, MD;  Location: [facility];  Service: Orthopedics;  Laterality: Right;   TONSILLECTOMY     TOTAL HIP ARTHROPLASTY Left 02/26/2021   Procedure: TOTAL HIP ARTHROPLASTY ANTERIOR APPROACH;  Surgeon: Melodi Lerner, MD;  Location: [facility];  Service: Orthopedics;  Laterality: Left;    WRIST SURGERY     HPI:  Patient is a 89 y.o. male with PMH: GERD, HTN,  RA/OA, HOH who was found down and altered at home. At baseline, patient lives by himself, drives to church and grocery store and does not need any assistance from family. When EMS arrived, patient found to be aphasic but stable. CT head showed large left-sided fluid collection with associated midline shift; Remote lacunar infarcts in the bilateral basal ganglia. He underwent surgical  decompression of left-sided SDH with bur hole drainage on 06/18/24. He has been NPO, failed [facility] with RN.   Assessment / Plan / Recommendation Clinical Impression  Patient presents with moderate-severe cognitive-linguistic impairments as per this evaluation. Son in room indicated that he is [facility] and has visual impairment (wears glasses mainly for reading on his tablet) but is independent, lives by himself, drives to church and grocery store. During this evaluation, patient exhibits significant cognitive-linguistic impairments in areas of initiation and attention. He did not attempt any verbal or non-verbal communication and made minimal, fleeting eye contact with SLP or his son even with cues. At rest, he was awake and alert, staring straight ahead and at times, staring at his hand as he held it in front of himself. He would not turn head to left or right even with cues. He was not able to follow one-step commands even with visual model and SLP speaking in very loud voice. He did not adequately attend to printed, one-step command held in front of him. He did reflexively open his mouth when SLP presented ice chip. SLP will continue to follow for therapeutic intervention to address his cognitive-linguistic impairments.  SLP Assessment  SLP Recommendation/Assessment: Patient needs continued Speech Language Pathology Services SLP Visit Diagnosis: Cognitive communication deficit (R41.841)     Assistance Recommended at Discharge  Frequent or constant Supervision/Assistance  Functional Status Assessment Patient has had a recent  decline in their functional status and demonstrates the ability to make significant improvements in function in a reasonable and predictable amount of time.  Frequency and Duration min 3x week  2 weeks      SLP Evaluation Cognition  Overall Cognitive Status: Impaired/Different from baseline Arousal/Alertness: Awake/alert Orientation Level: Oriented to person Attention: Focused Focused Attention: Impaired Focused Attention Impairment: Functional basic;Verbal basic Problem Solving: Impaired Problem Solving Impairment: Functional basic       Comprehension  Auditory Comprehension Overall Auditory Comprehension: Impaired Yes/No Questions: Impaired (0%) Commands: Impaired (0%) Interfering Components: Attention;Hearing;Visual impairments Visual Recognition/Discrimination Discrimination: Not tested Reading Comprehension Reading Status: Not tested    Expression Expression Primary Mode of Expression: Verbal Verbal Expression Overall Verbal Expression: Impaired Initiation: Impaired Interfering Components: Attention   Oral / Motor  Oral Motor/Sensory Function Overall Oral Motor/Sensory Function: Mild impairment Facial Symmetry: Abnormal symmetry right Motor Speech Overall Motor Speech: Other (comment) (UTA, no verbal output)           Norleen IVAR Blase, MA, CCC-SLP Speech Therapy

## 2024-06-19 NOTE — Progress Notes (Addendum)
 Patient extremely restless and agitated, trying to remove mits and equipment, trying to get out of bed. Family at bedside. Lights turned down, repositioned in bed and patient reassured. Patient remains restless. Temp 100.4. [facility] notified.   Haldol  ordered PRN for agitation, no new orders regarding temperature. MD advised to notify if temp reaches 102.

## 2024-06-19 NOTE — Consult Note (Signed)
 NAME:  Kenneth Roy, MRN:  [id number], DOB:  [birth year], LOS: 1 ADMISSION DATE:  06/18/2024, CONSULTATION DATE:  06/19/2024 REFERRING MD:  Victory Gens, MD, CHIEF COMPLAINT:  ICU consult  History of Present Illness:  89 y/o male with PMH for RA/OA, GERD, HTN who was found down and alerted at home.  Patient's LKW per son was 8/31 when son last spoke to patient.  Son called a neighbhor to attend to his father when he did not return his call and did not show up to an appointmewnt earlier in the day.  He was found on the floor next to an overturned table.   BIB EMS aphasic but stable.  Son feels this is an acute event as patient was wearing clothes he normally goes out in.  Some skin tears noted in ED.   CT head with large left-sided fluid collection with associated MLS.  CT C-spine without fracture, dislocation, or traumatic malalignment.  Chest x-ray without focal airspace opacification, cardiomediastinal sweat derangement, pneumothorax, pleural effusion, bony derangement.  Pelvic x-ray without pelvic ring disruption, hips located, no displaced fracture.  No displaced fracture or dislocation on plain films of the right shoulder, elbow, foot DG Shoulder Right. CT head again revealed a large SDH.  He was taken to OR.  Preop Chronic subdural hematoma left hemisphere with shift and neural deficit.  He is now s/p Solmon hole and Ventriculostomy drain. Pertinent  Medical History  RA/OA, GERD, HTN  Significant Hospital Events: Including procedures, antibiotic start and stop dates in addition to other pertinent events     Interim History / Subjective:  N/a  Objective    Blood pressure (!) 114/51, pulse 60, temperature 99.3 F (37.4 C), temperature source Axillary, resp. rate 19, SpO2 97%.        Intake/Output Summary (Last 24 hours) at 06/19/2024 0208 Last data filed at 06/18/2024 2300 Gross per 24 hour  Intake 1555 ml  Output 220 ml  Net 1335 ml   There were no vitals filed for this  visit.  Examination: General: sleepy, head drain in place HENT: Pupils small but reactive, neck supple, no jvd Lungs: CTA no wheezes no rales Cardiovascular: reg s1s2 no murmurs or rubs Abdomen: soft nt nd bs pos Extremities: no cyanosis, clubbing or edema, hammer toes Neuro: sleepy but awakens, follows simple commands, non-verbal but moves all extremities GU: Foley  Resolved problem list   Assessment and Plan  S/p fall No fractures of right arm/shoulder  S/p Bur hole drainage of left chronic subdural hematoma and placement of Ventriculostomy drain Primary Neurosurgery Drain management per Neurosurgery RA/OA On Sulfasalazine  and Simponi  Aria Stable at this time HTN Currently BP low end of normal GERD HOB elevate to 30 degrees Antireflux meds   Labs   CBC: Recent Labs  Lab 06/18/24 1918 06/18/24 1925  WBC 7.6  --   NEUTROABS 5.7  --   HGB 14.4 14.3  HCT 41.7 42.0  MCV 100.7*  --   PLT 184  --     Basic Metabolic Panel: Recent Labs  Lab 06/18/24 1918 06/18/24 1925  NA 131* 132*  K 3.5 3.5  CL 97* 98  CO2 19*  --   GLUCOSE 99 100*  BUN 10 10  CREATININE 0.79 0.50*  CALCIUM 7.7*  --    GFR: CrCl cannot be calculated (Unknown ideal weight.). Recent Labs  Lab 06/18/24 1918  WBC 7.6    Liver Function Tests: Recent Labs  Lab 06/18/24 1918  AST  37  ALT 21  ALKPHOS 60  BILITOT 2.8*  PROT 6.6  ALBUMIN 3.0*   No results for input(s): LIPASE, AMYLASE in the last 168 hours. No results for input(s): AMMONIA in the last 168 hours.  ABG    Component Value Date/Time   TCO2 19 (L) 06/18/2024 1925     Coagulation Profile: No results for input(s): INR, PROTIME in the last 168 hours.  Cardiac Enzymes: Recent Labs  Lab 06/18/24 1918  CKTOTAL 526*    HbA1C: No results found for: HGBA1C  CBG: Recent Labs  Lab 06/18/24 1744  GLUCAP 103*    Review of Systems:   aphasic  Past Medical History:  He,  has a past medical  history of GERD (gastroesophageal reflux disease), Hypertension, Osteoarthritis, and Rheumatoid arthritis (HCC).   Surgical History:   Past Surgical History:  Procedure Laterality Date   ANKLE SURGERY Left    EYE SURGERY     INNER EAR SURGERY Bilateral    JOINT REPLACEMENT     REPLACEMENT TOTAL KNEE Right    RETINAL DETACHMENT SURGERY Right    SYNOVECTOMY Right 07/24/2021   Procedure: SYNOVECTOMY;  Surgeon: Camella Fallow, MD;  Location: [facility];  Service: Orthopedics;  Laterality: Right;   TENDON REPAIR Right 07/24/2021   Procedure: TENDON REPAIR OF WRIST, TENDON SYNOVECTOMY AND TENDON TRANSFER AND RECONSTRUCTION,SECONDARY TO MULTIPLE RUPTURES,AND POSSIBLE DISTAL RADIAL ULNAR JOINT RESECTION WITH TENDON TRANSFER;  Surgeon: Camella Fallow, MD;  Location: [facility];  Service: Orthopedics;  Laterality: Right;  BLOCK WITH IV SEDATION   TENDON TRANSFER Right 07/24/2021   Procedure: TENDON TRANSFER;  Surgeon: Camella Fallow, MD;  Location: [facility];  Service: Orthopedics;  Laterality: Right;   TONSILLECTOMY     TOTAL HIP ARTHROPLASTY Left 02/26/2021   Procedure: TOTAL HIP ARTHROPLASTY ANTERIOR APPROACH;  Surgeon: Melodi Lerner, MD;  Location: [facility];  Service: Orthopedics;  Laterality: Left;    WRIST SURGERY       Social History:   reports that he has quit smoking. He has never used smokeless tobacco. He reports current alcohol  use. He reports that he does not use drugs.   Family History:  His family history is not on file.   Allergies No Known Allergies   Home Medications  Prior to Admission medications   Medication Sig Start Date End Date Taking? Authorizing Provider  cholecalciferol  (VITAMIN D3) 25 MCG (1000 UNIT) tablet Take 1,000 Units by mouth daily.    [provider]  lisinopril  (ZESTRIL ) 20 MG tablet Take 20 mg by mouth daily. 01/11/21   [provider]  Multiple Vitamins-Minerals (CENTRUM SILVER PO) Take 1 tablet by mouth daily.    [provider]  sulfaSALAzine  (AZULFIDINE ) 500 MG tablet Take 1,000 mg by mouth 2 (two) times daily.    [provider]     Critical care time: 62   The patient is critically ill with multiple organ system failure and requires high complexity decision making for assessment and support, frequent evaluation and titration of therapies, advanced monitoring, review of radiographic studies and interpretation of complex data.   Critical Care Time devoted to patient care services, exclusive of separately billable procedures, described in this note is 31 minutes.   Orlin Fairly, MD [facility] See Amion for pager  If no response to pager , please call [phone] until 7pm After 7:00 pm call Elink  [phone] 06/19/2024, 2:08 AM

## 2024-06-20 ENCOUNTER — Inpatient Hospital Stay (HOSPITAL_COMMUNITY)

## 2024-06-20 ENCOUNTER — Other Ambulatory Visit: Payer: Self-pay

## 2024-06-20 DIAGNOSIS — R569 Unspecified convulsions: Secondary | ICD-10-CM

## 2024-06-20 DIAGNOSIS — S065XAA Traumatic subdural hemorrhage with loss of consciousness status unknown, initial encounter: Secondary | ICD-10-CM | POA: Diagnosis not present

## 2024-06-20 LAB — GLUCOSE, CAPILLARY
Glucose-Capillary: 74 mg/dL (ref 70–99)
Glucose-Capillary: 78 mg/dL (ref 70–99)
Glucose-Capillary: 81 mg/dL (ref 70–99)
Glucose-Capillary: 88 mg/dL (ref 70–99)
Glucose-Capillary: 93 mg/dL (ref 70–99)
Glucose-Capillary: 96 mg/dL (ref 70–99)

## 2024-06-20 MED ORDER — VALPROATE SODIUM 100 MG/ML IV SOLN
500.0000 mg | Freq: Three times a day (TID) | INTRAVENOUS | Status: DC
  Administered 2024-06-20 – 2024-06-21 (×3): 500 mg via INTRAVENOUS
  Filled 2024-06-20 (×5): qty 5

## 2024-06-20 MED ORDER — VALPROATE SODIUM 100 MG/ML IV SOLN
1500.0000 mg | INTRAVENOUS | Status: AC
  Administered 2024-06-20: 1500 mg via INTRAVENOUS
  Filled 2024-06-20: qty 15

## 2024-06-20 MED ORDER — LEVETIRACETAM (KEPPRA) 500 MG/5 ML ADULT IV PUSH
500.0000 mg | Freq: Once | INTRAVENOUS | Status: DC
  Filled 2024-06-20: qty 5

## 2024-06-20 MED ORDER — LEVETIRACETAM (KEPPRA) 500 MG/5 ML ADULT IV PUSH
2000.0000 mg | Freq: Once | INTRAVENOUS | Status: AC
  Administered 2024-06-20: 2000 mg via INTRAVENOUS
  Filled 2024-06-20: qty 20

## 2024-06-20 MED ORDER — LORAZEPAM 2 MG/ML IJ SOLN
INTRAMUSCULAR | Status: AC
  Administered 2024-06-20: 2 mg via INTRAVENOUS
  Filled 2024-06-20: qty 1

## 2024-06-20 MED ORDER — LORAZEPAM 2 MG/ML IJ SOLN
2.0000 mg | INTRAMUSCULAR | Status: AC
  Administered 2024-06-20: 2 mg via INTRAVENOUS
  Filled 2024-06-20: qty 1

## 2024-06-20 MED ORDER — LORAZEPAM 2 MG/ML IJ SOLN: 2.0000 mg | Freq: Once | INTRAMUSCULAR | Status: AC

## 2024-06-20 MED ORDER — LISINOPRIL 20 MG PO TABS: 20.0000 mg | ORAL_TABLET | Freq: Every day | ORAL | Status: DC

## 2024-06-20 MED ORDER — LEVETIRACETAM (KEPPRA) 500 MG/5 ML ADULT IV PUSH
1000.0000 mg | Freq: Two times a day (BID) | INTRAVENOUS | Status: DC
  Administered 2024-06-20 – 2024-06-21 (×3): 1000 mg via INTRAVENOUS
  Filled 2024-06-20 (×3): qty 10

## 2024-06-20 MED ORDER — SODIUM CHLORIDE 0.9 % IV SOLN
100.0000 mg | Freq: Two times a day (BID) | INTRAVENOUS | Status: DC
  Filled 2024-06-20: qty 10

## 2024-06-20 MED ORDER — VALPROATE SODIUM 100 MG/ML IV SOLN
1000.0000 mg | Freq: Once | INTRAVENOUS | Status: DC
  Filled 2024-06-20: qty 10

## 2024-06-20 MED ORDER — LACTATED RINGERS IV SOLN: INTRAVENOUS | Status: DC

## 2024-06-20 MED ORDER — VALPROATE SODIUM 100 MG/ML IV SOLN
1000.0000 mg | INTRAVENOUS | Status: AC
  Administered 2024-06-20: 1000 mg via INTRAVENOUS
  Filled 2024-06-20: qty 10

## 2024-06-20 MED ORDER — SODIUM CHLORIDE 0.9 % IV SOLN
200.0000 mg | INTRAVENOUS | Status: AC
  Administered 2024-06-20: 200 mg via INTRAVENOUS
  Filled 2024-06-20: qty 20

## 2024-06-20 MED ORDER — SODIUM CHLORIDE 0.9 % IV SOLN
200.0000 mg | Freq: Two times a day (BID) | INTRAVENOUS | Status: DC
  Administered 2024-06-20 – 2024-06-21 (×2): 200 mg via INTRAVENOUS
  Filled 2024-06-20 (×3): qty 20

## 2024-06-20 NOTE — Progress Notes (Signed)
 LTM EEG hooked up and running - no initial skin breakdown - push button tested - [facility] monitoring.

## 2024-06-20 NOTE — Progress Notes (Signed)
 OT Cancellation Note  Patient Details Name: Kenneth Roy MRN: [id number] DOB: 06-08-1927   Cancelled Treatment:    Reason Eval/Treat Not Completed: Medical issues which prohibited therapy (pt with new seizure activity, RN asking to hold, will f/u for OT eval next date as schedule permits.)  Kenneth Roy, OTD, OTR/L [facility] [phone]   Kenneth Roy 06/20/2024, 1:31 PM

## 2024-06-20 NOTE — Consult Note (Signed)
 NEUROLOGY CONSULT NOTE   Date of service: June 20, 2024 Patient Name: Kenneth Roy MRN:  [id number] DOB:  10/07/1927 Chief Complaint: Seizures Requesting Provider: Maree Harder, MD  History of Present Illness  Kenneth Roy is a 89 y.o. male with hx of rheumatoid arthritis and hypertension brought in for evaluation of confusion and inability to talk.  Was mildly agitated.  CT head showed a significant chronic subdural hematoma on the left side with 7 mm left-to-right midline shift.  Apparently he lives independently, and is completely able to take care of things although speed with which he walks and does things has slowed down over time per RN report. He was taken to the OR for subdural evacuation via bur hole.  He was admitted to the ICU where earlier this morning around 2:15 AM, he was witnessed to have seizure-like activity.  He was given Versed  and that has since subsided. Neurology was consulted for further management of seizures  ROS  To ascertain due to his mentation  Past History   Past Medical History:  Diagnosis Date   GERD (gastroesophageal reflux disease)    Hypertension    Osteoarthritis    Rheumatoid arthritis (HCC)     Past Surgical History:  Procedure Laterality Date   ANKLE SURGERY Left    EYE SURGERY     INNER EAR SURGERY Bilateral    JOINT REPLACEMENT     REPLACEMENT TOTAL KNEE Right    RETINAL DETACHMENT SURGERY Right    SYNOVECTOMY Right 07/24/2021   Procedure: SYNOVECTOMY;  Surgeon: Camella Fallow, MD;  Location: [facility];  Service: Orthopedics;  Laterality: Right;   TENDON REPAIR Right 07/24/2021   Procedure: TENDON REPAIR OF WRIST, TENDON SYNOVECTOMY AND TENDON TRANSFER AND RECONSTRUCTION,SECONDARY TO MULTIPLE RUPTURES,AND POSSIBLE DISTAL RADIAL ULNAR JOINT RESECTION WITH TENDON TRANSFER;  Surgeon: Camella Fallow, MD;  Location: [facility];  Service: Orthopedics;  Laterality: Right;  BLOCK WITH IV SEDATION   TENDON TRANSFER Right 07/24/2021   Procedure:  TENDON TRANSFER;  Surgeon: Camella Fallow, MD;  Location: [facility];  Service: Orthopedics;  Laterality: Right;   TONSILLECTOMY     TOTAL HIP ARTHROPLASTY Left 02/26/2021   Procedure: TOTAL HIP ARTHROPLASTY ANTERIOR APPROACH;  Surgeon: Melodi Lerner, MD;  Location: [facility];  Service: Orthopedics;  Laterality: Left;    WRIST SURGERY      Family History: History reviewed. No pertinent family history.  Social History  reports that he has quit smoking. He has never used smokeless tobacco. He reports current alcohol  use. He reports that he does not use drugs.  No Known Allergies  Medications   Current Facility-Administered Medications:    0.9 %  sodium chloride  infusion, , Intravenous, Continuous, Elsner, Henry, MD, Last Rate: 75 mL/hr at 06/20/24 0000, Infusion Verify at 06/20/24 0000   bisacodyl  (DULCOLAX) suppository 10 mg, 10 mg, Rectal, Daily PRN, Colon Shove, MD   ceFAZolin  (ANCEF ) IVPB 2g/100 mL premix, 2 g, Intravenous, Q8H, Colon Shove, MD, Stopped at 06/19/24 2147   Chlorhexidine  Gluconate Cloth 2 % PADS 6 each, 6 each, Topical, Q0600, Colon Shove, MD, 6 each at 06/19/24 0409   docusate sodium  (COLACE) capsule 100 mg, 100 mg, Oral, BID, Colon Shove, MD   haloperidol  lactate (HALDOL ) injection 2 mg, 2 mg, Intravenous, Q6H PRN, Claudene Agent, MD   HYDROmorphone  (DILAUDID ) injection 0.5 mg, 0.5 mg, Intravenous, Q2H PRN, Colon Shove, MD, 0.5 mg at 06/19/24 2107   insulin  aspart (novoLOG ) injection 0-9 Units, 0-9 Units, Subcutaneous, Q4H, Maree,  Orlin, MD, 1 Units at 06/19/24 1143   labetalol  (NORMODYNE ) injection 10-40 mg, 10-40 mg, Intravenous, Q10 min PRN, Colon Shove, MD   levETIRAcetam  (KEPPRA ) undiluted injection 1,000 mg, 1,000 mg, Intravenous, Q12H, Claudene Agent, MD   levETIRAcetam  (KEPPRA ) undiluted injection 2,000 mg, 2,000 mg, Intravenous, Once, Claudene Agent, MD   ondansetron  (ZOFRAN ) tablet 4 mg, 4 mg, Oral, Q4H PRN **OR** ondansetron  (ZOFRAN ) injection 4 mg, 4  mg, Intravenous, Q4H PRN, Colon Shove, MD   Oral care mouth rinse, 15 mL, Mouth Rinse, PRN, Colon Shove, MD   pantoprazole  (PROTONIX ) injection 40 mg, 40 mg, Intravenous, QHS, Colon Shove, MD, 40 mg at 06/19/24 2113   polyethylene glycol (MIRALAX  / GLYCOLAX ) packet 17 g, 17 g, Oral, Daily PRN, Colon Shove, MD   promethazine  (PHENERGAN ) tablet 12.5-25 mg, 12.5-25 mg, Oral, Q4H PRN, Colon Shove, MD   senna (SENOKOT) tablet 8.6 mg, 1 tablet, Oral, BID, Colon Shove, MD   sodium phosphate  (FLEET) enema 1 enema, 1 enema, Rectal, Once PRN, Colon Shove, MD  Vitals   Vitals:   06/19/24 2200 06/19/24 2300 06/19/24 2315 06/20/24 0000  BP: (!) 113/47 (!) 116/47  (!) 142/69  Pulse: 71 68  68  Resp: 20 18  16   Temp:   (!) 101 F (38.3 C)   TempSrc:   Axillary   SpO2: 98% 94%  100%  Weight:        Body mass index is 23.77 kg/m.   Physical Exam  General: Drowsy, opens eyes, does not follow commands HEENT: Head wrapped in a bandage CVS: Regular rhythm Abdomen nondistended nontender Neurological exam Very drowsy, opens eyes Does not follow commands Pupils are equal round reactive to light, somewhat left gaze preference, does not blink to threat from either side, facial symmetry difficult to ascertain due to dentition but grossly looks symmetric. Motor examination: Voluntarily moves the left upper and lower extremity and has some weakness on the right but to noxious stimulation is able to withdraw both the right upper and lower extremity Sensory examination: As above   Labs/Imaging/Neurodiagnostic studies   CBC:  Recent Labs  Lab 2024-07-05 1918 07-05-2024 1925 06/19/24 0551  WBC 7.6  --  6.3  NEUTROABS 5.7  --   --   HGB 14.4 14.3 13.1  HCT 41.7 42.0 37.9*  MCV 100.7*  --  99.2  PLT 184  --  191   Basic Metabolic Panel:  Lab Results  Component Value Date   NA 131 (L) 06/19/2024   K 3.5 06/19/2024   CO2 20 (L) 06/19/2024   GLUCOSE 152 (H) 06/19/2024   BUN 10  06/19/2024   CREATININE 0.82 06/19/2024   CALCIUM 7.9 (L) 06/19/2024   GFRNONAA >60 06/19/2024   Lipid Panel: No results found for: LDLCALC HgbA1c:  Lab Results  Component Value Date   HGBA1C 4.5 (L) 06/19/2024   CT Head without contrast(Personally reviewed): Postop CT head with interval decrease in the left subdural fluid collection and midline shift following left frontal burr hole craniotomy and placement of subdural drain.   ASSESSMENT   Yonathan L Halfhill is a 89 y.o. male who is being admitted to the neurological ICU after having sustained a left convexity subdural hematoma now status post evacuation via bur hole who had witnessed seizure activity in the ICU earlier this morning. He was currently on Keppra  500 twice daily I recommended that his Keppra  dose be increased after a load.  Impression: Seizure in the setting of subdural hematoma  RECOMMENDATIONS  Increase Keppra  dose to 1000 twice daily after giving it 2 g IV x 1 load. Routine EEG in the morning Continue close neuromonitoring Maintain seizure precautions Management of the subdural hematoma per neurosurgery Neurology will follow Discussed with intensivist Dr. Claudene ______________________________________________________________________    Signed, Eligio Lav, MD [facility]  CRITICAL CARE ATTESTATION Performed by: Eligio Lav, MD Total critical care time: 44 minutes Critical care time was exclusive of separately billable procedures and treating other patients and/or supervising APPs/Residents/Students Critical care was necessary to treat or prevent imminent or life-threatening deterioration. This patient is critically ill and at significant risk for neurological worsening and/or death and care requires constant monitoring. Critical care was time spent personally by me on the following activities: development of treatment plan with patient and/or surrogate as well as nursing, discussions with  consultants, evaluation of patient's response to treatment, examination of patient, obtaining history from patient or surrogate, ordering and performing treatments and interventions, ordering and review of laboratory studies, ordering and review of radiographic studies, pulse oximetry, re-evaluation of patient's condition, participation in multidisciplinary rounds and medical decision making of high complexity in the care of this patient.

## 2024-06-20 NOTE — Progress Notes (Addendum)
 Kenneth Roy is a 89 y.o. male with hx of rheumatoid arthritis and hypertension brought in for evaluation of confusion and inability to talk.  Was mildly agitated.  CT head showed a significant chronic subdural hematoma on the left side with 7 mm left-to-right midline shift.  Apparently he lives independently, and is completely able to take care of things   He was taken to the OR for subdural evacuation via bur hole.  He was admitted to the ICU where earlier this morning around 2:15 AM, he was witnessed to have seizure-like activity.  He was given Versed  and that has since subsided.  Seizure on Routine EEG, converted to LTM.  Loaded with vimpat .  Neurosurgeon removed drain this am-stopped putting out and may help with brain irritation given seizures.    Past Medical History:  Diagnosis Date   GERD (gastroesophageal reflux disease)    Hypertension    Osteoarthritis    Rheumatoid arthritis (HCC)     Past Surgical History:  Procedure Laterality Date   ANKLE SURGERY Left    EYE SURGERY     INNER EAR SURGERY Bilateral    JOINT REPLACEMENT     REPLACEMENT TOTAL KNEE Right    RETINAL DETACHMENT SURGERY Right    SYNOVECTOMY Right 07/24/2021   Procedure: SYNOVECTOMY;  Surgeon: Camella Fallow, MD;  Location: [facility];  Service: Orthopedics;  Laterality: Right;   TENDON REPAIR Right 07/24/2021   Procedure: TENDON REPAIR OF WRIST, TENDON SYNOVECTOMY AND TENDON TRANSFER AND RECONSTRUCTION,SECONDARY TO MULTIPLE RUPTURES,AND POSSIBLE DISTAL RADIAL ULNAR JOINT RESECTION WITH TENDON TRANSFER;  Surgeon: Camella Fallow, MD;  Location: [facility];  Service: Orthopedics;  Laterality: Right;  BLOCK WITH IV SEDATION   TENDON TRANSFER Right 07/24/2021   Procedure: TENDON TRANSFER;  Surgeon: Camella Fallow, MD;  Location: [facility];  Service: Orthopedics;  Laterality: Right;   TONSILLECTOMY     TOTAL HIP ARTHROPLASTY Left 02/26/2021   Procedure: TOTAL HIP ARTHROPLASTY ANTERIOR APPROACH;  Surgeon: Melodi Lerner,  MD;  Location: [facility];  Service: Orthopedics;  Laterality: Left;    WRIST SURGERY      Family History: History reviewed. No pertinent family history.  Social History  reports that he has quit smoking. He has never used smokeless tobacco. He reports current alcohol  use. He reports that he does not use drugs.  No Known Allergies  Medications   Current Facility-Administered Medications:    0.9 %  sodium chloride  infusion, , Intravenous, Continuous, Colon Shove, MD, Last Rate: 75 mL/hr at 06/20/24 0900, Infusion Verify at 06/20/24 0900   bisacodyl  (DULCOLAX) suppository 10 mg, 10 mg, Rectal, Daily PRN, Colon Shove, MD   Chlorhexidine  Gluconate Cloth 2 % PADS 6 each, 6 each, Topical, Q0600, Colon Shove, MD, 6 each at 06/20/24 0700   haloperidol  lactate (HALDOL ) injection 2 mg, 2 mg, Intravenous, Q6H PRN, Claudene Agent, MD   HYDROmorphone  (DILAUDID ) injection 0.5 mg, 0.5 mg, Intravenous, Q2H PRN, Colon Shove, MD, 0.5 mg at 06/20/24 0900   insulin  aspart (novoLOG ) injection 0-9 Units, 0-9 Units, Subcutaneous, Q4H, Maree Harder, MD, 1 Units at 06/19/24 1143   labetalol  (NORMODYNE ) injection 10-40 mg, 10-40 mg, Intravenous, Q10 min PRN, Colon Shove, MD   lacosamide  (VIMPAT ) 100 mg in sodium chloride  0.9 % 25 mL IVPB, 100 mg, Intravenous, Q12H, Rebeka Kimble, Zeke HERO, MD   levETIRAcetam  (KEPPRA ) undiluted injection 1,000 mg, 1,000 mg, Intravenous, Q12H, Claudene Agent, MD, 1,000 mg at 06/20/24 9060   ondansetron  (ZOFRAN ) tablet 4 mg, 4 mg, Oral, Q4H  PRN **OR** ondansetron  (ZOFRAN ) injection 4 mg, 4 mg, Intravenous, Q4H PRN, Colon Shove, MD   Oral care mouth rinse, 15 mL, Mouth Rinse, PRN, Colon Shove, MD   pantoprazole  (PROTONIX ) injection 40 mg, 40 mg, Intravenous, QHS, Elsner, Shove, MD, 40 mg at 06/19/24 2113   polyethylene glycol (MIRALAX  / GLYCOLAX ) packet 17 g, 17 g, Oral, Daily PRN, Colon Shove, MD   promethazine  (PHENERGAN ) tablet 12.5-25 mg, 12.5-25 mg, Oral, Q4H PRN,  Colon Shove, MD   sodium phosphate  (FLEET) enema 1 enema, 1 enema, Rectal, Once PRN, Colon Shove, MD  Vitals   Vitals:   06/20/24 0700 06/20/24 0800 06/20/24 0808 06/20/24 0900  BP: (!) 164/107 (!) 160/92  (!) 169/92  Pulse: 89 86  82  Resp: (!) 28 (!) 30  (!) 24  Temp:   (!) 100.8 F (38.2 C)   TempSrc:   Axillary   SpO2: 91% (!) 84%  94%  Weight:        Body mass index is 23.98 kg/m.   Physical Exam  General: Drowsy, opens eyes, does not follow commands HEENT: Head wrapped in a bandage. Drain removed.  Neurological exam Very drowsy, opens eyes Does not follow commands Pupils are equal round reactive to light, somewhat left gaze preference, does not blink to threat from either side, facial symmetry difficult to ascertain due to dentition but grossly looks symmetric. Motor examination: Voluntarily moves the left upper and lower extremity and has some weakness on the right but to noxious stimulation is able to withdraw both the right upper and lower extremity    Labs/Imaging/Neurodiagnostic studies   CBC:  Recent Labs  Lab 2024-07-02 1918 July 02, 2024 1925 06/19/24 0551  WBC 7.6  --  6.3  NEUTROABS 5.7  --   --   HGB 14.4 14.3 13.1  HCT 41.7 42.0 37.9*  MCV 100.7*  --  99.2  PLT 184  --  191   Basic Metabolic Panel:  Lab Results  Component Value Date   NA 131 (L) 06/19/2024   K 3.5 06/19/2024   CO2 20 (L) 06/19/2024   GLUCOSE 152 (H) 06/19/2024   BUN 10 06/19/2024   CREATININE 0.82 06/19/2024   CALCIUM 7.9 (L) 06/19/2024   GFRNONAA >60 06/19/2024   Lipid Panel: No results found for: LDLCALC HgbA1c:  Lab Results  Component Value Date   HGBA1C 4.5 (L) 06/19/2024   CT Head without contrast(Personally reviewed): Postop CT head with interval decrease in the left subdural fluid collection and midline shift following left frontal burr hole craniotomy and placement of subdural drain.  Routine EEG: This EEG recorded to epileptic seizures.  The first is  associated with vocalization, and I suspect some facial twitching though it is very difficult to make out from the video.  The second is not associated with any clinical sign.   Aisha Seals, MD [facility]  ASSESSMENT   Kenneth Roy is a 89 y.o. male who is being admitted to the neurological ICU after having sustained a left convexity subdural hematoma now status post evacuation via bur hole who had witnessed seizure activity in the ICU earlier this morning.  Impression: partial status in the setting of subdural hematoma  9/7: overnight loaded with Keppra  2,000mg  increased to 1,000mg  BID. Given ativan  2mg .    RECOMMENDATIONS   8am: Loaded with Vimpat  200mg . LMT still showing partial status: ativan  2mg  given.  Loaded with depakote 15/mg/kg. Then 500mg  q8hrs.   Maintain seizure precautions Neurology will follow Discussed with intensivist  Dr. Theodoro and Dr. Colon.  Long discussed with pt's son and daughter. Updated him on condition, may require intubation.   He is FULL CODE.     This patient is critically ill due to Status and at significant risk of neurological worsening, death form heart failure, respiratory failure, recurrent stroke, bleeding from [facility], seizure, sepsis. This patient's care requires constant monitoring of vital signs, hemodynamics, respiratory and cardiac monitoring, review of multiple databases, neurological assessment, discussion with family, other specialists and medical decision making of high complexity. I spent 40 minutes of neurocritical care time in the care of this patient.   Adryel Wortmann,MD  ______________________________________________________________________

## 2024-06-20 NOTE — Progress Notes (Signed)
 Patient ID: Kenneth Roy, male   DOB: 06-Sep-1927, 89 y.o.   MRN: [id number] The patient has had some deterioration overnight with the onset of seizures.  He has also had some slight drainage from around the catheter.  Neurology has seen the patient and he is currently on the monitor it appears that he is having some focal twitching of the left side of his face.  Considering the drainage around the catheter I have removed the subdural catheter today.  It appears to have backed out a few centimeters on its own.  Surgical staples were placed in the incision.  I spoke with the daughter at some length today noting that the patient's frail status has regards to his brain.  Hopefully his seizures will come under control we will see some improvement in neural function however given the fact that he has had at least partial drainage of the subdural and no substantial improvement is noted I believe his condition is very guarded.  Will continue to follow along conservatively.

## 2024-06-20 NOTE — Progress Notes (Signed)
 PT Cancellation Note  Patient Details Name: Kenneth Roy MRN: [id number] DOB: [birth year]   Cancelled Treatment:    Reason Eval/Treat Not Completed: Medical issues which prohibited therapy remain this afternoon, after discussion with RN, pt with medical decline overnight and onset of seizures. Will continue to follow and evaluate as appropriate.   Izetta Call, PT, DPT   Acute Rehabilitation Department Office [phone] Secure Chat Communication Preferred   Izetta JULIANNA Call 06/20/2024, 1:30 PM

## 2024-06-20 NOTE — Progress Notes (Signed)
 Around 0900, drain was removed by Dr. Colon relating to the little-to-no output being produced. Patient also had an estimated ~5 seizures overnight, to which neurology was consulted, and LTM cEEG was also initiated during/after drain removal. RN and Dr. Nichola was at bedside and each noticed facial twitching and muscle tonicity. 2mg  of Ativan , Vimpat , and 1000mg  of Ativan  given.  Dr. Nichola and Dr. Theodoro both aware, and each discussed the possibility of mechanical ventilation if seizures were unable to resolve with current methods. Family appeared to be agreeable to mechanical ventilation if required to stop seizures (through high doses of AEDs and sedation).   Nicolina Hirt, RN

## 2024-06-20 NOTE — Progress Notes (Signed)
 eLink Physician-Brief Progress Note Patient Name: Kenneth Roy DOB: 16-Dec-1926 MRN: [id number]   Date of Service  06/20/2024  HPI/Events of Note  I have spoken to the patient's 3 daughters who feel in the event of cardiac or respiratory arrest the patient would not want intubation, CPR, defibrillation or other advanced cardiac life support measures.    eICU Interventions  Patient is Do Not Resuscitate     Intervention Category Major Interventions: End of life / care limitation discussion  CLAUDENE LYNWOOD SQUIBB 06/20/2024, 8:37 PM

## 2024-06-20 NOTE — Progress Notes (Signed)
 SLP Cancellation Note  Patient Details Name: Kenneth Roy MRN: [id number] DOB: 1927/02/01   Cancelled treatment:       Reason Eval/Treat Not Completed: Medical issues which prohibited therapy. Patient with decline overnight with onset of seizures. SLP will continue to follow.    Norleen IVAR Blase, MA, CCC-SLP Speech Therapy

## 2024-06-20 NOTE — Progress Notes (Signed)
 EEG complete - results pending

## 2024-06-20 NOTE — Progress Notes (Signed)
 Neuro assessment complete, no changes in assessment. Encouraged patient to cough up secretions in back of throat, performed mouth care and repositioned to right side. Patient became rigid, facial twitching noted, eyes deviated up to the right. Vitals remained stable. CCM and Neuro surgery notified.IV access poor with leaky IVs after seizures. IV team consulted and at bedside to place IVs. Neurology was consulted and came to bedside. 2g of keppra  ordered now and daily dose increased to 1g. EEG ordered.  Seizure times and duration: 0222 1 minute VS stable 0233 1 minute 45 seconds VS Stable 0243 1 minute SAT down to80 % 0252 1 minute VS stable 0304 1 minute ativan  given VS stable

## 2024-06-20 NOTE — Consult Note (Addendum)
 NAME:  Kenneth Roy, MRN:  [id number], DOB:  Mar 22, 1927, LOS: 2 ADMISSION DATE:  06/18/2024, CONSULTATION DATE:  06/19/2024 REFERRING MD:  Victory Gens, MD, CHIEF COMPLAINT:  ICU consult  History of Present Illness:  89 y/o male with PMH for RA/OA, GERD, HTN who was found down and alerted at home.  Patient's LKW per son was 8/31 when son last spoke to patient.  Son called a neighbhor to attend to his father when he did not return his call and did not show up to an appointmewnt earlier in the day.  He was found on the floor next to an overturned table.   BIB EMS aphasic but stable.  Son feels this is an acute event as patient was wearing clothes he normally goes out in.  Some skin tears noted in ED.   CT head with large left-sided fluid collection with associated MLS.  CT C-spine without fracture, dislocation, or traumatic malalignment.  Chest x-ray without focal airspace opacification, cardiomediastinal sweat derangement, pneumothorax, pleural effusion, bony derangement.  Pelvic x-ray without pelvic ring disruption, hips located, no displaced fracture.  No displaced fracture or dislocation on plain films of the right shoulder, elbow, foot DG Shoulder Right. CT head again revealed a large SDH.  He was taken to OR.  Preop Chronic subdural hematoma left hemisphere with shift and neural deficit.  He is now s/p Solmon hole and Ventriculostomy drain. Pertinent  Medical History  RA/OA, GERD, HTN  Significant Hospital Events: Including procedures, antibiotic start and stop dates in addition to other pertinent events   9/7: New seizures.  On AEDs.  Drain out.  Interim History / Subjective:  Patient overnight had witnessed seizures.  Neurology following.  After load of Keppra  he is not having any clinical seizures.  However EEGs showing subclinical seizures.  Loaded with Vimpat  and Depakote.     Objective    Blood pressure (!) 136/56, pulse 69, temperature 99.8 F (37.7 C), temperature source Axillary, resp.  rate (!) 21, weight 67.4 kg, SpO2 96%.        Intake/Output Summary (Last 24 hours) at 06/20/2024 1242 Last data filed at 06/20/2024 1200 Gross per 24 hour  Intake 2400.08 ml  Output 170 ml  Net 2230.08 ml   Filed Weights   06/19/24 0422 06/20/24 0434  Weight: 66.8 kg 67.4 kg    Examination: General: Unresponsive. HENT: Pupils small but reactive, neck supple, no jvd Lungs: Clear to auscultation bilaterally Cardiovascular: reg s1s2 no murmurs or rubs Abdomen: Soft nontender nondistended. Neuro: Unresponsive.  Not responding to verbal stimuli.  Resolved problem list   Assessment and Plan  Left chronic subdural hematoma status post placement of ventriculostomy drain, status post bur hole drainage: - Neurosurgery primary. - Removal of ventriculostomy drain on 9/7. -Prior to fall patient had good functional outcome. -Neurochecks. -SBP less than 140.  Seizures: - Neurology following. - Status post Keppra  load, Vimpat  load and Depakote load. - On Keppra .  Added Vimpat  and Depakote - LTM monitoring.  RA/OA On Sulfasalazine  and Simponi  Aria  HTN Resume lisinopril  when able to swallow.  On as needed hydralazine and labetalol   GERD Antireflux meds  Had a conversation with patient's daughter.  Told her about risk of intubation, his age, subdural hematoma and likely poor outcome in the future.  For now the patient is full code.  However they are going to convene as a family to discuss about the CODE STATUS.   Labs   CBC: Recent Labs  Lab  06/18/24 1918 06/18/24 1925 06/19/24 0551  WBC 7.6  --  6.3  NEUTROABS 5.7  --   --   HGB 14.4 14.3 13.1  HCT 41.7 42.0 37.9*  MCV 100.7*  --  99.2  PLT 184  --  191    Basic Metabolic Panel: Recent Labs  Lab 06/18/24 1918 06/18/24 1925 06/19/24 0551  NA 131* 132* 131*  K 3.5 3.5 3.5  CL 97* 98 99  CO2 19*  --  20*  GLUCOSE 99 100* 152*  BUN 10 10 10   CREATININE 0.79 0.50* 0.82  CALCIUM 7.7*  --  7.9*  MG  --   --  1.8   PHOS  --   --  3.0   GFR: Estimated Creatinine Clearance: 49.7 mL/min (by C-G formula based on SCr of 0.82 mg/dL). Recent Labs  Lab 06/18/24 1918 06/19/24 0551  WBC 7.6 6.3    Liver Function Tests: Recent Labs  Lab 06/18/24 1918 06/19/24 0551  AST 37 28  ALT 21 19  ALKPHOS 60 50  BILITOT 2.8* 1.6*  PROT 6.6 6.1*  ALBUMIN 3.0* 2.5*   No results for input(s): LIPASE, AMYLASE in the last 168 hours. No results for input(s): AMMONIA in the last 168 hours.  ABG    Component Value Date/Time   TCO2 19 (L) 06/18/2024 1925     Coagulation Profile: No results for input(s): INR, PROTIME in the last 168 hours.  Cardiac Enzymes: Recent Labs  Lab 06/18/24 1918  CKTOTAL 526*    HbA1C: Hgb A1c MFr Bld  Date/Time Value Ref Range Status  06/19/2024 05:51 AM 4.5 (L) 4.8 - 5.6 % Final    Comment:    (NOTE) Diagnosis of Diabetes The following HbA1c ranges recommended by the American Diabetes Association (ADA) may be used as an aid in the diagnosis of diabetes mellitus.  Hemoglobin             Suggested A1C NGSP%              Diagnosis  <5.7                   Non Diabetic  5.7-6.4                Pre-Diabetic  >6.4                   Diabetic  <7.0                   Glycemic control for                       adults with diabetes.      CBG: Recent Labs  Lab 06/19/24 2022 06/19/24 2346 06/20/24 0431 06/20/24 0724 06/20/24 1119  GLUCAP 108* 83 93 78 96   Past Medical History:  He,  has a past medical history of GERD (gastroesophageal reflux disease), Hypertension, Osteoarthritis, and Rheumatoid arthritis (HCC).   Surgical History:   Past Surgical History:  Procedure Laterality Date   ANKLE SURGERY Left    EYE SURGERY     INNER EAR SURGERY Bilateral    JOINT REPLACEMENT     REPLACEMENT TOTAL KNEE Right    RETINAL DETACHMENT SURGERY Right    SYNOVECTOMY Right 07/24/2021   Procedure: SYNOVECTOMY;  Surgeon: Camella Fallow, MD;  Location: [facility] OR;   Service: Orthopedics;  Laterality: Right;   TENDON REPAIR Right 07/24/2021   Procedure: TENDON REPAIR OF WRIST, TENDON  SYNOVECTOMY AND TENDON TRANSFER AND RECONSTRUCTION,SECONDARY TO MULTIPLE RUPTURES,AND POSSIBLE DISTAL RADIAL ULNAR JOINT RESECTION WITH TENDON TRANSFER;  Surgeon: Camella Fallow, MD;  Location: [facility] OR;  Service: Orthopedics;  Laterality: Right;  BLOCK WITH IV SEDATION   TENDON TRANSFER Right 07/24/2021   Procedure: TENDON TRANSFER;  Surgeon: Camella Fallow, MD;  Location: [facility] OR;  Service: Orthopedics;  Laterality: Right;   TONSILLECTOMY     TOTAL HIP ARTHROPLASTY Left 02/26/2021   Procedure: TOTAL HIP ARTHROPLASTY ANTERIOR APPROACH;  Surgeon: Melodi Lerner, MD;  Location: [facility] OR[facility];  Service: Orthopedics;  Laterality: Left;    WRIST SURGERY       Social History:   reports that he has quit smoking. He has never used smokeless tobacco. He reports current alcohol  use. He reports that he does not use drugs.   Family History:  His family history is not on file.   Allergies No Known Allergies   Home Medications  Prior to Admission medications   Medication Sig Start Date End Date Taking? Authorizing Provider  cholecalciferol  (VITAMIN D3) 25 MCG (1000 UNIT) tablet Take 1,000 Units by mouth daily.    [provider]  lisinopril  (ZESTRIL ) 20 MG tablet Take 20 mg by mouth daily. 01/11/21   [provider]  Multiple Vitamins-Minerals (CENTRUM SILVER PO) Take 1 tablet by mouth daily.    [provider]  sulfaSALAzine  (AZULFIDINE ) 500 MG tablet Take 1,000 mg by mouth 2 (two) times daily.    [provider]     CRITICAL CARE Performed by: Sammi JONETTA Fredericks.     Total critical care time: 32 minutes   Critical care time was exclusive of separately billable procedures and treating other patients.   Critical care was necessary to treat or prevent imminent or life-threatening deterioration.   Critical care was time spent personally by me on  the following activities: development of treatment plan with patient and/or surrogate as well as nursing, discussions with consultants, evaluation of patient's response to treatment, examination of patient, obtaining history from patient or surrogate, ordering and performing treatments and interventions, ordering and review of laboratory studies, ordering and review of radiographic studies, pulse oximetry, re-evaluation of patient's condition and participation in multidisciplinary rounds.  Sammi JONETTA Fredericks, MD Pulmonary, Critical Care and Sleep Attending.  Pager: [phone]  06/20/2024, 1:09 PM

## 2024-06-20 NOTE — Procedures (Addendum)
 History: 89 year old with subdural hematoma being evaluated for seizures  EEG Duration: 22 minutes  Sedation: None  Patient State: Awake and asleep  Technique: This EEG was acquired with electrodes placed according to the International 10-20 electrode system with the exception of the left parasagittal leads due to a surgical incision. (Leads applied include Fp1, Fp2,  F4, C4, P4, O1, O2, T3, T4, T5, T6, A1, A2, Fz, Cz, Pz). The following electrodes were missing or displaced: none.   Background: There is a posterior dominant rhythm seen which is reactive and symmetric achieving a frequency of 8 to 9 Hz.  In addition there is mild generalized irregular slow activity throughout the recording.  There are also two epileptic seizures, with poorly localized onset.  At the first last approximately 2 minutes, and the second begins about 30 seconds before the end of recording is ongoing at the time the recording ends.  Photic stimulation: Physiologic driving is not performed  EEG Abnormalities: Two epileptic seizures seen bilaterally with poorly localized onset, possibly related to absent leads.  Clinical Interpretation: This EEG recorded two epileptic seizures.  The first is associated with vocalization, and I suspect some facial twitching though it is very difficult to make out from the video.  The second is not associated with any clinical sign.  Aisha Seals, MD [facility]   If 7pm- 7am, please page neurology on call as listed in AMION.

## 2024-06-20 NOTE — Progress Notes (Signed)
 eLink Physician-Brief Progress Note Patient Name: Kenneth Roy DOB: Mar 19, 1927 MRN: [id number]   Date of Service  06/20/2024  HPI/Events of Note  Nurse reports patient with seizure lasting 1 minute x 2.  Nurse has paged neurosurgery.   He is on keppra .  eICU Interventions  Consult neurology Additional 500 mg of keppra  IV now     Intervention Category Major Interventions: Seizures - evaluation and management  CLAUDENE AGENT, P 06/20/2024, 2:30 AM

## 2024-06-20 NOTE — Progress Notes (Signed)
  Sedated and sleeping. No visible seizures, twitching.  Not requiring intubation.  Overall VEEG: Intermittent runs that are brief and improving maximal Left P3 but still present.    Load with another 2G keppra . Increase vimpat  to 200mg  BID.    Discussed with pts daughter.

## 2024-06-20 NOTE — Progress Notes (Signed)
 Dr. Colon notified of new drainage on dressing around drain. No new orders.

## 2024-06-21 ENCOUNTER — Encounter (HOSPITAL_COMMUNITY): Payer: Self-pay | Admitting: Neurological Surgery

## 2024-06-21 ENCOUNTER — Inpatient Hospital Stay (HOSPITAL_COMMUNITY)

## 2024-06-21 DIAGNOSIS — R4589 Other symptoms and signs involving emotional state: Secondary | ICD-10-CM

## 2024-06-21 DIAGNOSIS — I6200 Nontraumatic subdural hemorrhage, unspecified: Secondary | ICD-10-CM | POA: Diagnosis not present

## 2024-06-21 DIAGNOSIS — G9389 Other specified disorders of brain: Secondary | ICD-10-CM | POA: Diagnosis not present

## 2024-06-21 DIAGNOSIS — Z7189 Other specified counseling: Secondary | ICD-10-CM

## 2024-06-21 DIAGNOSIS — Z982 Presence of cerebrospinal fluid drainage device: Secondary | ICD-10-CM | POA: Diagnosis not present

## 2024-06-21 DIAGNOSIS — J9601 Acute respiratory failure with hypoxia: Secondary | ICD-10-CM | POA: Diagnosis not present

## 2024-06-21 DIAGNOSIS — Z66 Do not resuscitate: Secondary | ICD-10-CM | POA: Diagnosis not present

## 2024-06-21 DIAGNOSIS — Z789 Other specified health status: Secondary | ICD-10-CM

## 2024-06-21 DIAGNOSIS — M542 Cervicalgia: Secondary | ICD-10-CM | POA: Diagnosis not present

## 2024-06-21 DIAGNOSIS — S199XXA Unspecified injury of neck, initial encounter: Secondary | ICD-10-CM | POA: Diagnosis not present

## 2024-06-21 DIAGNOSIS — Z515 Encounter for palliative care: Secondary | ICD-10-CM

## 2024-06-21 DIAGNOSIS — S065XAA Traumatic subdural hemorrhage with loss of consciousness status unknown, initial encounter: Secondary | ICD-10-CM | POA: Diagnosis not present

## 2024-06-21 DIAGNOSIS — R569 Unspecified convulsions: Secondary | ICD-10-CM | POA: Diagnosis not present

## 2024-06-21 LAB — BASIC METABOLIC PANEL WITH GFR
Anion gap: 14 (ref 5–15)
BUN: 12 mg/dL (ref 8–23)
CO2: 22 mmol/L (ref 22–32)
Calcium: 8 mg/dL — ABNORMAL LOW (ref 8.9–10.3)
Chloride: 102 mmol/L (ref 98–111)
Creatinine, Ser: 0.8 mg/dL (ref 0.61–1.24)
GFR, Estimated: >60 mL/min (ref >60)
Glucose, Bld: 80 mg/dL (ref 70–99)
Potassium: 3.8 mmol/L (ref 3.5–5.1)
Sodium: 136 mmol/L (ref 135–145)

## 2024-06-21 LAB — GLUCOSE, CAPILLARY
Glucose-Capillary: 72 mg/dL (ref 70–99)
Glucose-Capillary: 79 mg/dL (ref 70–99)

## 2024-06-21 LAB — MAGNESIUM: Magnesium: 2 mg/dL (ref 1.7–2.4)

## 2024-06-21 LAB — VALPROIC ACID LEVEL: Valproic Acid Lvl: 57 ug/mL (ref 50–100)

## 2024-06-21 MED ORDER — GLYCOPYRROLATE 0.2 MG/ML IJ SOLN
0.2000 mg | INTRAMUSCULAR | Status: DC | PRN
  Administered 2024-06-21: 0.2 mg via INTRAVENOUS
  Filled 2024-06-21: qty 1

## 2024-06-21 MED ORDER — MORPHINE 100MG IN NS 100ML (1MG/ML) PREMIX INFUSION
0.0000 mg/h | INTRAVENOUS | Status: DC
  Administered 2024-06-21 – 2024-06-22 (×2): 5 mg/h via INTRAVENOUS
  Filled 2024-06-21 (×2): qty 100

## 2024-06-21 MED ORDER — ONDANSETRON HCL 4 MG/2ML IJ SOLN: 4.0000 mg | Freq: Four times a day (QID) | INTRAMUSCULAR | Status: DC | PRN

## 2024-06-21 MED ORDER — POLYVINYL ALCOHOL 1.4 % OP SOLN: 1.0000 [drp] | Freq: Four times a day (QID) | OPHTHALMIC | Status: DC | PRN

## 2024-06-21 MED ORDER — HALOPERIDOL LACTATE 5 MG/ML IJ SOLN: 2.5000 mg | INTRAMUSCULAR | Status: DC | PRN

## 2024-06-21 MED ORDER — ACETAMINOPHEN 325 MG PO TABS: 650.0000 mg | ORAL_TABLET | Freq: Four times a day (QID) | ORAL | Status: DC | PRN

## 2024-06-21 MED ORDER — GLYCOPYRROLATE 0.2 MG/ML IJ SOLN: 0.2000 mg | INTRAMUSCULAR | Status: DC | PRN

## 2024-06-21 MED ORDER — GLYCOPYRROLATE 1 MG PO TABS: 1.0000 mg | ORAL_TABLET | ORAL | Status: DC | PRN

## 2024-06-21 MED ORDER — LEVETIRACETAM (KEPPRA) 500 MG/5 ML ADULT IV PUSH: 1500.0000 mg | Freq: Two times a day (BID) | INTRAVENOUS | Status: DC

## 2024-06-21 MED ORDER — GLYCOPYRROLATE 0.2 MG/ML IJ SOLN
0.1000 mg | INTRAMUSCULAR | Status: DC | PRN
  Administered 2024-06-21 (×2): 0.1 mg via INTRAVENOUS
  Filled 2024-06-21 (×2): qty 1

## 2024-06-21 MED ORDER — SODIUM CHLORIDE 0.9 % IV SOLN: INTRAVENOUS | Status: AC

## 2024-06-21 MED ORDER — ACETAMINOPHEN 650 MG RE SUPP: 650.0000 mg | Freq: Four times a day (QID) | RECTAL | Status: DC | PRN

## 2024-06-21 MED ORDER — LORAZEPAM 2 MG/ML IJ SOLN: 2.0000 mg | INTRAMUSCULAR | Status: DC | PRN

## 2024-06-21 MED ORDER — MORPHINE BOLUS VIA INFUSION: 5.0000 mg | INTRAVENOUS | Status: DC | PRN

## 2024-06-21 MED ORDER — ONDANSETRON 4 MG PO TBDP: 4.0000 mg | ORAL_TABLET | Freq: Four times a day (QID) | ORAL | Status: DC | PRN

## 2024-06-21 NOTE — Progress Notes (Signed)
 EEG Head box disconnected, the patient is gone to CT for exam.

## 2024-06-21 NOTE — IPAL (Signed)
  Interdisciplinary Goals of Care Family Meeting   Date carried out: 06/21/2024  Location of the meeting: Conference room  Member's involved: Physician, Bedside Registered Nurse, and Family Member or next of kin  Durable Power of Attorney or acting medical decision maker: 3 daughters and son   Discussion: We discussed goals of care for [employer]. Met with patient's four children who are joint decision makers. Patient is widowed. Dr. Vanessa with neurology was also present for the meeting. We discussed events leading up to admission, SDH with evacuation and subsequent development of seizures. Reviewed overnight event of decision to make him DNR/DNI. Dr. Vanessa reviewed difficulties with obtaining seizure suppression without intubating the patient. He is currently on 3 AEDs and further addition would likely not provide relief and aggressive measures would involve intubating patient and general anesthesia for seizure control. Discussed that even despite this, there are hurdles to extubation given his age, and that he would likely not return to his baseline and QOL would suffer. Reviewed that he would likely be bed bound with 24 hr care in a facility which his children stated he would not want. We discussed what comfort care/hospice care looks like and medications we can provide for comfort and treatment of anxiety of pain. They are very concerned with ensuring he is comfortable. They would like some time to think about the options and have requested a consult with palliative care to discuss options regarding inpt vs outpt hospice, etc. This has been completed. In the interim, we will continue him on 3 AEDs and monitor him clinically.   Code status:   Code Status: Limited: Do not attempt resuscitation (DNR) -DNR-LIMITED -Do Not Intubate/DNI    Disposition: Continue current acute care  Time spent for the meeting: 35  Tinnie FORBES Furth, PA-C [facility] 06/21/24 1:06  PM  Please see [email or web address] for pager details.  From 7A-7P if no response, please call [phone] After hours, please call ELink [phone]

## 2024-06-21 NOTE — Consult Note (Signed)
 Palliative Care Consult Note                                  Date: 06/21/2024   Patient Name: Kenneth Roy  DOB: 09-12-1927  MRN: [id number]  Age / Sex: 89 y.o., male  PCP: Tisovec, Charlie ORN, MD Referring Physician: Colon Shove, MD  Reason for Consultation: Establishing goals of care  Past Medical History:  Diagnosis Date   GERD (gastroesophageal reflux disease)    Hypertension    Osteoarthritis    Rheumatoid arthritis (HCC)     Subjective:   This NP Camellia Kays reviewed medical records, received report from team, assessed the patient and then meet at the patient's bedside to discuss diagnosis, prognosis, GOC, EOL wishes disposition and options.  Before meeting with the patient/family, I spent time reviewing the chart notes including neurology note from today, PCCM note from today, neurology note from today, IPAL note from PCCM. I also reviewed vital signs, nursing flowsheets, medication administrations record, labs, and imaging. Labs reviewed include BMP showing normal creatinine which is important to consider with medication selection of likely approaching comfort care, valproic  acid level 57 considering persistent seizures despite AEDs.  I met with the patient at the bedside, although he is unable to meaningfully communicate.  Multiple family members are present and we excused ourselves to the conference room for discussion..   We meet to discuss diagnosis prognosis, GOC, EOL wishes, disposition and options. Concept of Palliative Care was introduced as specialized medical care for people and their families living with serious illness.  If focuses on providing relief from the symptoms and stress of a serious illness.  The goal is to improve quality of life for both the patient and the family. Values and goals of care important to patient and family were attempted to be elicited.  Created space and opportunity for patient  and family to  explore thoughts and feelings regarding current medical situation   Natural trajectory and current clinical status were discussed. Questions and concerns addressed. Patient  encouraged to call with questions or concerns.    Patient/Family Understanding of Illness: They understand he has a chronic SDH and now having seizures.  He is on 3 AEDs which is making him sedated and sleepy.  Despite this his seizures are continuing and he is having 2 to 5/h.  They know that the more this continues to stronger that pathway becomes an increased likelihood that they will continue.  We spent some time talking about his acute clinical picture in detail.  Life Review: The patient is 30, with still living alone, still driving up until his hospitalization.  He is described as fiercely independent and a focus on quality of life.  He turns 89 tomorrow.  Patient Values: Functional independence, quality of life, family  Baseline Status: Still driving, living alone, fully independent in IADLs.  Today's Discussion: In addition to discussion described above we have extensive discussion on various topics.  We talked a lot about the patient, his focus on functional independence and quality of life.  We talked about the unfortunate clinical situation he find himself send with recurrent and recalcitrant seizures, 2 to 5/min, despite 3 AEDs.  Only options for continued aggressive care would likely resulted in intubation which he may not ever be extubated from.  Family is very clear that the patient would not want a breathing tube/ventilator nor would he want 24-hour facility  care which is a very likely possibility if we move forward on this path.  Overall the medical team does not feel he will ever return to near his baseline.  Family feels that if he cannot Back to his functional and independent life he would not want to continue like this.  We spent some time talk about options if we cannot fix his situation in a way that  he would want.  We discussed comfort care. I explained comfort care as care where the patient would no longer receive aggressive medical interventions such as continuous vital signs, lab work, radiology testing, or medications not focused on comfort, peace, and dignity. This includes stopping antibiotics and weaning oxygen to room air, as these are generally not accepted as providing comfort but only prolonging the dying process artificially. All care would focus on how the patient is looking and feeling. This would include management of any symptoms that may cause discomfort, pain, shortness of breath/air hunger, increased work of breathing, cough, nausea, agitation/restlessness, anxiety, and/or secretions etc. Symptoms would be managed with medications and other non-pharmacological interventions such as spiritual support if requested, repositioning, music therapy, or therapeutic listening. Family verbalized understanding and agreement.   We also talked about comfort care outside the hospital which is called hospice. I described hospice as a service for patients who have a life expectancy of 6 months or less. The goal of hospice is the preservation of dignity and quality at the end phases of life. Under hospice care, the focus changes from curative to symptom relief. I explained the three setting where hospice services can be provided including the home, at a living facility (such as LTC SNF, Assisted Living, etc), and a hospice facility. I explained that acceptance to hospice in any specific location is the final decision of the hospice medical director and bed availability, if applicable. They verbalized understanding.  After deliberation back-and-forth and further discussion family has decided that they would like neurology to look at his EEG and see if his seizures have subsided.  If not, then they are agreeable to transition to comfort care as soon as today.  They would like to engage with hospice to be  evaluated for inpatient hospice placement.  I provided emotional and general support through therapeutic listening, empathy, sharing of stories, and other techniques. I answered all questions and addressed all concerns to the best of my ability.  Goals: Further input from neurology on whether his seizures are continuing.  If so, transition to care and engage with hospice for inpatient placement.  Review of Systems  Unable to perform ROS   Objective:   Primary Diagnoses: Present on Admission:  Subdural hematoma (HCC)  Chronic subdural hematoma (HCC)  Subdural hematoma, post-traumatic (HCC)   Vital Signs:  BP 133/62   Pulse 84   Temp 100.3 F (37.9 C) (Axillary)   Resp 18   Wt 67.4 kg   SpO2 100%   BMI 23.98 kg/m   Physical Exam Vitals and nursing note reviewed.  Constitutional:      General: He is sleeping. He is not in acute distress. HENT:     Head: Normocephalic and atraumatic.  Cardiovascular:     Rate and Rhythm: Normal rate.  Pulmonary:     Effort: Pulmonary effort is normal. No respiratory distress.  Abdominal:     General: Abdomen is flat.     Palliative Assessment/Data: 10%   Advanced Care Planning:   Existing Vynca/ACP Documentation: None  Primary Decision Maker: NEXT  OF KIN  Pertinent diagnosis: Chronic SDH, new onset recalcitrant seizures, acute hypoxic respiratory failure  The patient and/or family consented to a voluntary Advance Care Planning Conversation in person. Individuals present for the conversation: Camellia Kays, NP, Kathlyne Bolder, NP, patient's son Larnell, daughters Lum Mom, [place], patient's daughter-in-law.  Summary of the conversation: We discussed the acute clinical situation, options moving forward, options from continued full and aggressive care through comfort care and multiple points in between, the patient's value of quality of life.  Outcome of the conversations and/or documents completed: Get input from neurology  whether her seizures have resolved today.  If no resolution or significant improvement transition to comfort (as soon as today), evaluation for inpatient hospice.  I spent 40 minutes providing separately identifiable ACP services with the patient and/or surrogate decision maker in a voluntary, in-person conversation discussing the patient's wishes and goals as detailed in the above note.  Assessment & Plan:   HPI/Patient Profile: 89 y.o. male  with past medical history of RA/OA, GERD, HTN who was found down and alerted at home. Patient's LKW per son was 8/31 when son last spoke to patient. In the ED a CT Head again revealed a large SDH. He was taken to OR. Preop Chronic subdural hematoma left hemisphere with shift and neural deficit. He is now s/p Solmon hole and Ventriculostomy drain. He was admitted on 06/18/2024 with left subdural hematoma with midline shift status post bur hole and ventriculostomy placement status post removal 9/7, new onset seizures status post SDH, subclinical seizure status, acute hypoxic respiratory failure secondary to above, and others.   Palliative medicine was consulted for goals of care conversation.  SUMMARY OF RECOMMENDATIONS   Remain DNR-limited Await neurology opinion on any change in seizures today If no significant improvement in seizure status transition to comfort care, likely today TOC consult to evaluate for inpatient hospice Palliative medicine will follow-up tomorrow  Symptom Management:  Per primary team Palliative medicine is available to assist, especially if transitions to comfort  Code Status: DNR - Limited (DNR/DNI)  Prognosis:  < 2 weeks  Discharge Planning:  Hospice facility   Discussed with: Patient's family, medical team, nursing team    Thank you for allowing us  to participate in the care of Joselito L Fesperman PMT will continue to support holistically.  Billing based on MDM: High  Problems Addressed: One acute or chronic illness or  injury that poses a threat to life or bodily function  Amount and/or Complexity of Data: Category 1:Review of prior external note(s) from each unique source, Review of the result(s) of each unique test, and Assessment requiring an independent historian(s) and Category 3:Discussion of management or test interpretation with external physician/other qualified health care professional/appropriate source (not separately reported)  Risks: Decision not to resuscitate or to de-escalate care because of poor prognosis  Detailed review of medical records (labs, imaging, vital signs), medically appropriate exam, discussed with treatment team, counseling and education to patient, family, & staff, documenting clinical information, medication management, coordination of care  Signed by: Camellia Kays, NP Palliative Medicine Team  Team Phone # [phone] (Nights/Weekends)  06/21/2024, 4:28 PM

## 2024-06-21 NOTE — Progress Notes (Signed)
 NEUROLOGY CONSULT FOLLOW UP NOTE   Date of service: June 21, 2024 Patient Name: Kenneth Roy MRN:  [id number] DOB:  May 23, 1927  Interval Hx/subjective   Continues to have seizures. Having 2-5 subclinical seizures an hour despite Keppra , vimpat , Valproic  acid.  He is very lethargic, moan but does not open eyes. gurgling respirations and some snoring.  He was made DNR/DNI last night.  Extensive discussion with patient's adult children at the bedside. He needs escalation of Anti seizure meds for seizure control. However, he is barely protecting his ariway with noted gurgling respirations suggesting pooled secretions in the back of his throat. Concern that further escalation of Aeds without securing airway could compromise his airway. Patient is DNI at this time.  Family struggling with making a decision about full comfort care. There is real concern that he may not come off the vent if he goes up on it.  There is also concern that his quality of life is going to decline significantly given his continued seizures. With his advanced age, his recovery likely will not be as robust.  Vitals   Vitals:   06/21/24 0800 06/21/24 0900 06/21/24 1000 06/21/24 1100  BP: 125/60 135/74 136/68 132/64  Pulse: 88 87 98 93  Resp: (!) 28 (!) 28 (!) 28 (!) 29  Temp:    100.3 F (37.9 C)  TempSrc:    Axillary  SpO2: 94% 96% (!) 74% 98%  Weight:         Body mass index is 23.98 kg/m.  Physical Exam   General: appears frail, ill HENT: dry oropharynx and mucosa, mouth slightly open and brething through his mouth. Normal external appearance of ears and nose. Bruise over left forehead/eye. Pooled secretions suctioned from the back of his throat. Neck: Supple, no pain or tenderness  CV: No JVD. No peripheral edema.  Pulmonary: Symmetric Chest rise. Gurgling sounds with respirations. Abdomen: Soft to touch, non-tender.  Ext: No cyanosis, edema, or deformity. Skin: No rash. Normal palpation of skin.    Musculoskeletal: Normal digits and nails by inspection. No clubbing.   Neurologic Examination  Mental status/Cognition: no response to loud voice or clap. Slight frowning to tactile stim. Grimaces to noxious stimuli. Does not open eyes to voice/tactile/noxious stimuli. Speech/language: moans with noxious stimuli but mute otherwise. Cranial nerves:   CN II Pupils equal and reactive to light, unable to assess for VF deficits.   CN III,IV,VI No gaze preference or deviation, no nystagmus   CN V Corneals intact BL   CN VII Symmetric facial grimace to nares stimulation.   CN VIII Does not tirn head towards speech   CN IX & X Weak gag elicited with suction in the back of oropharynx. No cough elicited but later noted to have brief spontaneous cough.   CN XI Head is midline   CN XII Does not protrude tongue on command.   Sensory/Motor:  Muscle bulk: poor, tone slightly increased Holds BL upper extremities close to his chest. Resists movements. Mild localization noted in BL uppers to nares stimulation. Withdraws BL lower extremities to nares stimulation.  Coordination/Complex Motor:  - Unable to assess.  Medications  Current Facility-Administered Medications:    bisacodyl  (DULCOLAX) suppository 10 mg, 10 mg, Rectal, Daily PRN, Colon Shove, MD   Chlorhexidine  Gluconate Cloth 2 % PADS 6 each, 6 each, Topical, Q0600, Colon Shove, MD, 6 each at 06/21/24 0513   glycopyrrolate  (ROBINUL ) injection 0.1 mg, 0.1 mg, Intravenous, Q4H PRN, Adolph, Lauren E, PA-C, 0.1 mg  at 06/21/24 1125   haloperidol  lactate (HALDOL ) injection 2 mg, 2 mg, Intravenous, Q6H PRN, Claudene Agent, MD, 2 mg at 06/20/24 2201   HYDROmorphone  (DILAUDID ) injection 0.5 mg, 0.5 mg, Intravenous, Q2H PRN, Colon Shove, MD, 0.5 mg at 06/20/24 0900   labetalol  (NORMODYNE ) injection 10-40 mg, 10-40 mg, Intravenous, Q10 min PRN, Colon Shove, MD   lacosamide  (VIMPAT ) 200 mg in sodium chloride  0.9 % 25 mL IVPB, 200 mg, Intravenous,  Q12H, Palikh, Zeke HERO, MD, Last Rate: 90 mL/hr at 06/21/24 1008, 200 mg at 06/21/24 1008   lactated ringers  infusion, , Intravenous, Continuous, Pawar, Rahul, MD, Last Rate: 50 mL/hr at 06/21/24 1000, Infusion Verify at 06/21/24 1000   levETIRAcetam  (KEPPRA ) undiluted injection 1,500 mg, 1,500 mg, Intravenous, Q12H, Celestial Barnfield, MD   ondansetron  (ZOFRAN ) tablet 4 mg, 4 mg, Oral, Q4H PRN **OR** ondansetron  (ZOFRAN ) injection 4 mg, 4 mg, Intravenous, Q4H PRN, Colon Shove, MD   Oral care mouth rinse, 15 mL, Mouth Rinse, PRN, Colon Shove, MD   pantoprazole  (PROTONIX ) injection 40 mg, 40 mg, Intravenous, QHS, Elsner, Shove, MD, 40 mg at 06/20/24 2200   polyethylene glycol (MIRALAX  / GLYCOLAX ) packet 17 g, 17 g, Oral, Daily PRN, Colon Shove, MD   promethazine  (PHENERGAN ) tablet 12.5-25 mg, 12.5-25 mg, Oral, Q4H PRN, Colon Shove, MD   sodium phosphate  (FLEET) enema 1 enema, 1 enema, Rectal, Once PRN, Colon Shove, MD   valproate (DEPACON ) 500 mg in dextrose  5 % 50 mL IVPB, 500 mg, Intravenous, Q8H, Nichola Zeke HERO, MD, Stopped at 06/21/24 0613  Labs and Diagnostic Imaging   CBC:  Recent Labs  Lab 06/18/24 1918 06/18/24 1925 06/19/24 0551  WBC 7.6  --  6.3  NEUTROABS 5.7  --   --   HGB 14.4 14.3 13.1  HCT 41.7 42.0 37.9*  MCV 100.7*  --  99.2  PLT 184  --  191    Basic Metabolic Panel:  Lab Results  Component Value Date   NA 136 06/21/2024   K 3.8 06/21/2024   CO2 22 06/21/2024   GLUCOSE 80 06/21/2024   BUN 12 06/21/2024   CREATININE 0.80 06/21/2024   CALCIUM 8.0 (L) 06/21/2024   GFRNONAA >60 06/21/2024   Lipid Panel: No results found for: LDLCALC HgbA1c:  Lab Results  Component Value Date   HGBA1C 4.5 (L) 06/19/2024   Urine Drug Screen: No results found for: LABOPIA, COCAINSCRNUR, LABBENZ, AMPHETMU, THCU, LABBARB  Alcohol  Level No results found for: [facility] INR  Lab Results  Component Value Date   INR 0.9 02/26/2021   APTT No results found  for: APTT AED levels: No results found for: PHENYTOIN, ZONISAMIDE, LAMOTRIGINE, LEVETIRACETA  CT Head without contrast(Personally reviewed): 1. Interval decrease in size of the left subdural fluid collection and midline shift following left frontal burr craniotomy and placement of a subdural drain.  Assessment   Jonathen L Westberg is a 89 y.o. male with hx of rheumatoid arthritis and hypertension brought in for evaluation of confusion and inability to talk.  Was mildly agitated.  CT head showed a significant chronic subdural hematoma on the left side with 7 mm left-to-right midline shift.  Apparently he lives independently, and is completely able to do all ADLs. He was taken to the OR for subdural evacuation via bur hole.  He was admitted to the ICU where around 2:15 AM on 06/20/24, he was witnessed to have seizure-like activity.  Despite escalation of AEDs and now on Keppra , Vimpat , Valproic  acid, LTM EEG continues  to show seizure without clinical signs arising from left posterior quadrant. Average 2-5 seizures were noted per hour, lasting 30 seconds to 1 minute each.  There is high concern for progression of respiratory decline with further escalation of antiseizure medications. Patient is DNR/DNI and therefore, have to be judicious with AEDS escalation. Will increase Keppra  to 1500mg  BID.  Recommendations  - continue Vimpat  200mg  BID - continue Valproic  acid 500mg  Q8H. VPA levels ordered - Keppra  increased to 1500mg  BID - family contemplating comfort care vs continuing current scope of care for another day or 2. All questions were answered. ______________________________________________________________________   Signed, Montario Zilka, MD [facility]

## 2024-06-21 NOTE — Progress Notes (Signed)
 Patient ID: Kenneth Roy, male   DOB: 09-Aug-1927, 89 y.o.   MRN: [id number] Daughter is present in the room this afternoon.  Clinically Kenneth Roy has not been improving and he has been continuing to have some seizures on 3 medications.  I ordered a CT of the head which was just completed and demonstrates that indeed his intracranial contents appear better there is some pneumocephalus at the site of the drain that has been removed but there is significantly less shift than there was preoperatively all in all the scan looks good considering the subdural evacuation that he had it certainly does not explain his poor status and continued seizures.  The daughters are considering comfort care measures and this may not be an unreasonable option given the fact that I do not see a correctable situation now other than continued aggressive treatment of his seizures.  Will discuss further tomorrow

## 2024-06-21 NOTE — Procedures (Addendum)
 Patient Name: Kenneth Roy  MRN: [id number]  Epilepsy Attending: Arlin MALVA Krebs  Referring Physician/Provider: Michaela Aisha SQUIBB, MD  Duration: 06/20/2024 0913 to 06/21/2024 0913  Patient history:  89 year old with subdural hematoma being evaluated for seizures   Level of alertness: Awake, asleep  AEDs during EEG study: LEV, VPA  Technical aspects: This EEG study was done with scalp electrodes positioned according to the 10-20 International system of electrode placement. Electrical activity was reviewed with band pass filter of 1-70Hz , sensitivity of 7 uV/mm, display speed of 41mm/sec with a 60Hz  notched filter applied as appropriate. EEG data were recorded continuously and digitally stored.  Video monitoring was available and reviewed as appropriate.  Description: No clear posterior dominant rhythm was seen. Sleep was characterized by vertex waves, sleep spindles (12 to 14 Hz), maximal frontocentral region.  EEG showed continuous generalized and lateralized left hemisphere 3 to 6 Hz theta-delta slowing. Lateralized periodic discharges with overriding fast activity were noted in left hemisphere, maximal left posterior quadrant  every 3-5 seconds, at times rhythmic lasting 3-5 seconds consistent with brief ictal-interictal rhythmic discharges.  Seizure without definite clinical signs was noted arising from left posterior quadrant.  During seizure, EEG showed polyspikes in left hemisphere, maximal left posterior quadrant at 1-2hz  with overlying rhythmicity admixed with 6-8hz  theta-alpha activity which then evolved to 2-3hz  delta slowing. Average 2-5 seizures were noted per hour, lasting 30 seconds to 1 minute each.   Hyperventilation and photic stimulation were not performed.     ABNORMALITY - Seizure without clinical signs, left posterior quadrant.  - Lateralized periodic discharges with overriding fast activity ( LPD +F) left hemisphere, maximal left posterior quadrant - Brief  ictal-interictal rhythmic discharges, left hemisphere, maximal left posterior quadrant - Continuous slow, generalized and lateralized left hemisphere,   IMPRESSION: This study showed seizure without clinical signs arising from left posterior quadrant. Average 2-5 seizures were noted per hour, lasting 30 seconds to 1 minute each.  Additionally EEG showed evidence of epileptogenicity arising from left hemisphere, maximal left posterior quadrant. This EEG pattern is on the ictal-interictal continuum with hugh suspicion for ictal nature due to the morphology and overlying rhythmicity.   EEG also showed cortical dysfunction arising from left hemisphere likely secondary to underlying structural abnormality.  Lastly there was moderate to severe diffuse encephalopathy,   Kenneth Roy

## 2024-06-21 NOTE — Consult Note (Deleted)
 NAME:  Kenneth Roy, MRN:  [id number], DOB:  January 30, 1927, LOS: 3 ADMISSION DATE:  06/18/2024, CONSULTATION DATE:  06/19/2024 REFERRING MD:  Victory Gens, MD, CHIEF COMPLAINT:  ICU consult  History of Present Illness:  89 y/o male with PMH for RA/OA, GERD, HTN who was found down and alerted at home.  Patient's LKW per son was 8/31 when son last spoke to patient.  Son called a neighbhor to attend to his father when he did not return his call and did not show up to an appointmewnt earlier in the day.  He was found on the floor next to an overturned table.   BIB EMS aphasic but stable.  Son feels this is an acute event as patient was wearing clothes he normally goes out in.  Some skin tears noted in ED.   CT head with large left-sided fluid collection with associated MLS.  CT C-spine without fracture, dislocation, or traumatic malalignment.  Chest x-ray without focal airspace opacification, cardiomediastinal sweat derangement, pneumothorax, pleural effusion, bony derangement.  Pelvic x-ray without pelvic ring disruption, hips located, no displaced fracture.  No displaced fracture or dislocation on plain films of the right shoulder, elbow, foot DG Shoulder Right. CT head again revealed a large SDH.  He was taken to OR.  Preop Chronic subdural hematoma left hemisphere with shift and neural deficit.  He is now s/p Solmon hole and Ventriculostomy drain. Pertinent  Medical History  RA/OA, GERD, HTN  Significant Hospital Events: Including procedures, antibiotic start and stop dates in addition to other pertinent events   9/7: New seizures.  On AEDs.  Drain out.  Interim History / Subjective:  Ongoing subclinical seizures q2-74m overnight despite 3 AED. Minimally responsive on exam. Family at bedside. Had long GOC discussion with children, see separate IPAL note. Dr. Khaliqdina with neurology also evaluated patient and gave recommendations.   Objective    Blood pressure 125/60, pulse 88, temperature 99.1 F (37.3  C), temperature source Axillary, resp. rate (!) 28, weight 67.4 kg, SpO2 94%.        Intake/Output Summary (Last 24 hours) at 06/21/2024 0814 Last data filed at 06/21/2024 0700 Gross per 24 hour  Intake 1491.9 ml  Output 410 ml  Net 1081.9 ml   Filed Weights   06/19/24 0422 06/20/24 0434  Weight: 66.8 kg 67.4 kg    Examination: General: elderly male, critically ill, unresponsive  HENT: pupils 1-50mm sluggish, no corneal, mucous membranes dry, upper airway congestion Lungs: shallow respirations without distress, upper airway congestion, strong cough but not clearing secretions without support  Cardiovascular: reg s1s2 no murmurs or rubs Abdomen: Soft nontender nondistended. Neuro: grimaces to painful stimulus, no verbal response, delayed movement of legs to painful stimulus. No gag. Does not open eyes to any stimulus   Resolved problem list   Assessment and Plan  Left chronic subdural hematoma with midline shift s/p burr hole and ventriculostomy placement s/p removal  - neurosurgery primary, appreciate help in management  - removed ventriculostomy on 9/7  - con't frequent neuro checks  - SBP goal < 140 - PRN labetalol    New-onset seizures s/p SDH  Subclinical status  EEG overnight 9/7-9/8 with focal seizures q2-62m every hour on 3 AED.  - con't keppra , vimpat , depakote  - neuro following, appreciate help in management  - con't LTM  - seizure precautions, aspriation precautions  - unfortunately, in precarious situation with DNR/DNI and lack of seizure suppression on 3 AEDs see below - ongoing GOC discussion  RA/OA - holding PO meds at this time   HTN - prn labetalol  for SBP control   GERD - clinically   GOC: long GOC discussion this morning. Dr. Khaliqdina also evaluated patient and gave recommendation. At this time, recommending movement to comfort measures as we are unlikely to get seizure suppression without intubating and sedating patient which is not in line with  wishes (DNR/DNI). Please see separate IPAL. Family is discussion their options throughout today.    Labs   CBC: Recent Labs  Lab 06/18/24 1918 06/18/24 1925 06/19/24 0551  WBC 7.6  --  6.3  NEUTROABS 5.7  --   --   HGB 14.4 14.3 13.1  HCT 41.7 42.0 37.9*  MCV 100.7*  --  99.2  PLT 184  --  191    Basic Metabolic Panel: Recent Labs  Lab 06/18/24 1918 06/18/24 1925 06/19/24 0551  NA 131* 132* 131*  K 3.5 3.5 3.5  CL 97* 98 99  CO2 19*  --  20*  GLUCOSE 99 100* 152*  BUN 10 10 10   CREATININE 0.79 0.50* 0.82  CALCIUM 7.7*  --  7.9*  MG  --   --  1.8  PHOS  --   --  3.0   GFR: Estimated Creatinine Clearance: 49.7 mL/min (by C-G formula based on SCr of 0.82 mg/dL). Recent Labs  Lab 06/18/24 1918 06/19/24 0551  WBC 7.6 6.3    Liver Function Tests: Recent Labs  Lab 06/18/24 1918 06/19/24 0551  AST 37 28  ALT 21 19  ALKPHOS 60 50  BILITOT 2.8* 1.6*  PROT 6.6 6.1*  ALBUMIN 3.0* 2.5*   No results for input(s): LIPASE, AMYLASE in the last 168 hours. No results for input(s): AMMONIA in the last 168 hours.  ABG    Component Value Date/Time   TCO2 19 (L) 06/18/2024 1925     Coagulation Profile: No results for input(s): INR, PROTIME in the last 168 hours.  Cardiac Enzymes: Recent Labs  Lab 06/18/24 1918  CKTOTAL 526*    HbA1C: Hgb A1c MFr Bld  Date/Time Value Ref Range Status  06/19/2024 05:51 AM 4.5 (L) 4.8 - 5.6 % Final    Comment:    (NOTE) Diagnosis of Diabetes The following HbA1c ranges recommended by the American Diabetes Association (ADA) may be used as an aid in the diagnosis of diabetes mellitus.  Hemoglobin             Suggested A1C NGSP%              Diagnosis  <5.7                   Non Diabetic  5.7-6.4                Pre-Diabetic  >6.4                   Diabetic  <7.0                   Glycemic control for                       adults with diabetes.      CBG: Recent Labs  Lab 06/20/24 1119 06/20/24 1521  06/20/24 1943 06/20/24 2337 06/21/24 0339  GLUCAP 96 74 81 88 72   Past Medical History:  He,  has a past medical history of GERD (gastroesophageal reflux disease), Hypertension, Osteoarthritis, and Rheumatoid arthritis (HCC).  Surgical History:   Past Surgical History:  Procedure Laterality Date   ANKLE SURGERY Left    EYE SURGERY     INNER EAR SURGERY Bilateral    JOINT REPLACEMENT     REPLACEMENT TOTAL KNEE Right    RETINAL DETACHMENT SURGERY Right    SYNOVECTOMY Right 07/24/2021   Procedure: SYNOVECTOMY;  Surgeon: Camella Fallow, MD;  Location: [facility];  Service: Orthopedics;  Laterality: Right;   TENDON REPAIR Right 07/24/2021   Procedure: TENDON REPAIR OF WRIST, TENDON SYNOVECTOMY AND TENDON TRANSFER AND RECONSTRUCTION,SECONDARY TO MULTIPLE RUPTURES,AND POSSIBLE DISTAL RADIAL ULNAR JOINT RESECTION WITH TENDON TRANSFER;  Surgeon: Camella Fallow, MD;  Location: [facility];  Service: Orthopedics;  Laterality: Right;  BLOCK WITH IV SEDATION   TENDON TRANSFER Right 07/24/2021   Procedure: TENDON TRANSFER;  Surgeon: Camella Fallow, MD;  Location: [facility];  Service: Orthopedics;  Laterality: Right;   TONSILLECTOMY     TOTAL HIP ARTHROPLASTY Left 02/26/2021   Procedure: TOTAL HIP ARTHROPLASTY ANTERIOR APPROACH;  Surgeon: Melodi Lerner, MD;  Location: [facility];  Service: Orthopedics;  Laterality: Left;    WRIST SURGERY       Social History:   reports that he has quit smoking. He has never used smokeless tobacco. He reports current alcohol  use. He reports that he does not use drugs.   Family History:  His family history is not on file.   Allergies No Known Allergies   Home Medications  Prior to Admission medications   Medication Sig Start Date End Date Taking? Authorizing Provider  cholecalciferol  (VITAMIN D3) 25 MCG (1000 UNIT) tablet Take 1,000 Units by mouth daily.    [provider]  lisinopril  (ZESTRIL ) 20 MG tablet Take 20 mg by mouth daily. 01/11/21   [provider]  Multiple Vitamins-Minerals (CENTRUM SILVER PO) Take 1 tablet by mouth daily.    [provider]  sulfaSALAzine  (AZULFIDINE ) 500 MG tablet Take 1,000 mg by mouth 2 (two) times daily.    [provider]     CCM time 40 Tinnie FORBES Adolph DEVONNA [facility] 06/21/24 9:02 AM  Please see [email or web address] for pager details.  From 7A-7P if no response, please call [phone] After hours, please call ELink [phone]

## 2024-06-21 NOTE — Progress Notes (Signed)
 vLTM maintenance  All impedances below 10k  No skin breakdown noted at FP1 FP2  FZ Cz

## 2024-06-21 NOTE — TOC CM/SW Note (Signed)
 Transition of Care [facility]) - Inpatient Brief Assessment   Patient Details  Name: RUAIRI STUTSMAN MRN: [id number] Date of Birth: 08-09-1927  Transition of Care [facility]) CM/SW Contact:    Adina Puzzo M, RN Phone Number: 06/21/2024, 4:34 PM   Clinical Narrative: Patient admitted on 06/18/2024 after being found down at home. He was found to be confused, mute and mildly agitated.CT head showed a significant chronic subdural hematoma on the left side with 7 mm left-to-right midline shift.  Apparently he lives independently, and is completely able to take care of his own ADLs.   Family has made patient DNR; palliative care to meet with family for goals of care.    Transition of Care Asessment: Insurance and Status: Insurance coverage has been reviewed Patient has primary care physician: Yes (Dr. JONELLE. Tisovec) Home environment has been reviewed: Lives alone Prior level of function:: Independent Prior/Current Home Services: No current home services   Readmission risk has been reviewed: Yes Transition of care needs: no transition of care needs at this time]  Mliss MICAEL Fass, RN, BSN  Trauma/Neuro ICU Case Manager [phone]

## 2024-06-21 NOTE — Progress Notes (Addendum)
 NAME:  Kenneth Roy, MRN:  [id number], DOB:  July 23, 1927, LOS: 3 ADMISSION DATE:  06/18/2024, CONSULTATION DATE:  06/19/2024 REFERRING MD:  Victory Gens, MD, CHIEF COMPLAINT:  ICU consult  History of Present Illness:  89 y/o male with PMH for RA/OA, GERD, HTN who was found down and alerted at home.  Patient's LKW per son was 8/31 when son last spoke to patient.  Son called a neighbhor to attend to his father when he did not return his call and did not show up to an appointmewnt earlier in the day.  He was found on the floor next to an overturned table.   BIB EMS aphasic but stable.  Son feels this is an acute event as patient was wearing clothes he normally goes out in.  Some skin tears noted in ED.   CT head with large left-sided fluid collection with associated MLS.  CT C-spine without fracture, dislocation, or traumatic malalignment.  Chest x-ray without focal airspace opacification, cardiomediastinal sweat derangement, pneumothorax, pleural effusion, bony derangement.  Pelvic x-ray without pelvic ring disruption, hips located, no displaced fracture.  No displaced fracture or dislocation on plain films of the right shoulder, elbow, foot DG Shoulder Right. CT head again revealed a large SDH.  He was taken to OR.  Preop Chronic subdural hematoma left hemisphere with shift and neural deficit.  He is now s/p Solmon hole and Ventriculostomy drain. Pertinent  Medical History  RA/OA, GERD, HTN  Significant Hospital Events: Including procedures, antibiotic start and stop dates in addition to other pertinent events   9/7: New seizures.  On AEDs.  Drain out.  Interim History / Subjective:  Ongoing subclinical seizures q2-82m overnight despite 3 AED. Minimally responsive on exam. Family at bedside. Had long GOC discussion with children, see separate IPAL note. Dr. Khaliqdina with neurology also evaluated patient and gave recommendations.   Objective    Blood pressure 136/68, pulse 98, temperature 99.1 F (37.3  C), temperature source Axillary, resp. rate (!) 28, weight 67.4 kg, SpO2 (!) 74%.        Intake/Output Summary (Last 24 hours) at 06/21/2024 1100 Last data filed at 06/21/2024 1000 Gross per 24 hour  Intake 1362.58 ml  Output 410 ml  Net 952.58 ml   Filed Weights   06/19/24 0422 06/20/24 0434  Weight: 66.8 kg 67.4 kg    Examination: General: elderly male, critically ill, unresponsive  HENT: pupils 1-48mm sluggish, no corneal, mucous membranes dry, upper airway congestion Lungs: shallow respirations without distress, upper airway congestion, strong cough but not clearing secretions without support  Cardiovascular: reg s1s2 no murmurs or rubs Abdomen: Soft nontender nondistended. Neuro: grimaces to painful stimulus, no verbal response, delayed movement of legs to painful stimulus. No gag. Does not open eyes to any stimulus   Resolved problem list   Assessment and Plan  Left chronic subdural hematoma with midline shift s/p burr hole and ventriculostomy placement s/p removal  - neurosurgery primary, appreciate help in management  - removed ventriculostomy on 9/7  - con't frequent neuro checks  - SBP goal < 140 - PRN labetalol    New-onset seizures s/p SDH  Subclinical status  EEG overnight 9/7-9/8 with focal seizures q2-69m every hour on 3 AED.  - con't keppra , vimpat , depakote  - neuro following, appreciate help in management  - con't LTM  - seizure precautions, aspriation precautions  - unfortunately, in precarious situation with DNR/DNI and lack of seizure suppression on 3 AEDs see below - ongoing GOC  discussion   Acute hypoxic respiratory failure 2/2 above  - con't O2 nasal cannula prn for sat >92%  - prn NTS to clear upper airway secretions  - robinul  0.1mg  q4h prn secretions   RA/OA - holding PO meds at this time   HTN - prn labetalol  for SBP control   GERD - clinically   NPO - receiving LR @ 50cc/hr  GOC: long GOC discussion this morning. Dr. Khaliqdina also  evaluated patient and gave recommendation. At this time, recommending movement to comfort measures as we are unlikely to get seizure suppression without intubating and sedating patient which is not in line with wishes (DNR/DNI). Please see separate IPAL. Family is discussion their options throughout today.   Labs   CBC: Recent Labs  Lab 06/18/24 1918 06/18/24 1925 06/19/24 0551  WBC 7.6  --  6.3  NEUTROABS 5.7  --   --   HGB 14.4 14.3 13.1  HCT 41.7 42.0 37.9*  MCV 100.7*  --  99.2  PLT 184  --  191    Basic Metabolic Panel: Recent Labs  Lab 06/18/24 1918 06/18/24 1925 06/19/24 0551 06/21/24 0654  NA 131* 132* 131* 136  K 3.5 3.5 3.5 3.8  CL 97* 98 99 102  CO2 19*  --  20* 22  GLUCOSE 99 100* 152* 80  BUN 10 10 10 12   CREATININE 0.79 0.50* 0.82 0.80  CALCIUM 7.7*  --  7.9* 8.0*  MG  --   --  1.8 2.0  PHOS  --   --  3.0  --    GFR: Estimated Creatinine Clearance: 51 mL/min (by C-G formula based on SCr of 0.8 mg/dL). Recent Labs  Lab 06/18/24 1918 06/19/24 0551  WBC 7.6 6.3    Liver Function Tests: Recent Labs  Lab 06/18/24 1918 06/19/24 0551  AST 37 28  ALT 21 19  ALKPHOS 60 50  BILITOT 2.8* 1.6*  PROT 6.6 6.1*  ALBUMIN 3.0* 2.5*   No results for input(s): LIPASE, AMYLASE in the last 168 hours. No results for input(s): AMMONIA in the last 168 hours.  ABG    Component Value Date/Time   TCO2 19 (L) 06/18/2024 1925     Coagulation Profile: No results for input(s): INR, PROTIME in the last 168 hours.  Cardiac Enzymes: Recent Labs  Lab 06/18/24 1918  CKTOTAL 526*    HbA1C: Hgb A1c MFr Bld  Date/Time Value Ref Range Status  06/19/2024 05:51 AM 4.5 (L) 4.8 - 5.6 % Final    Comment:    (NOTE) Diagnosis of Diabetes The following HbA1c ranges recommended by the American Diabetes Association (ADA) may be used as an aid in the diagnosis of diabetes mellitus.  Hemoglobin             Suggested A1C NGSP%              Diagnosis  <5.7                    Non Diabetic  5.7-6.4                Pre-Diabetic  >6.4                   Diabetic  <7.0                   Glycemic control for  adults with diabetes.      CBG: Recent Labs  Lab 06/20/24 1521 06/20/24 1943 06/20/24 2337 06/21/24 0339 06/21/24 0926  GLUCAP 74 81 88 72 79   Past Medical History:  He,  has a past medical history of GERD (gastroesophageal reflux disease), Hypertension, Osteoarthritis, and Rheumatoid arthritis (HCC).   Surgical History:   Past Surgical History:  Procedure Laterality Date   ANKLE SURGERY Left    BURR HOLE Left 06/18/2024   Procedure: CREATION, CRANIAL BURR HOLE;  Surgeon: Colon Shove, MD;  Location: [facility];  Service: Neurosurgery;  Laterality: Left;   EYE SURGERY     INNER EAR SURGERY Bilateral    JOINT REPLACEMENT     REPLACEMENT TOTAL KNEE Right    RETINAL DETACHMENT SURGERY Right    SYNOVECTOMY Right 07/24/2021   Procedure: SYNOVECTOMY;  Surgeon: Camella Fallow, MD;  Location: [facility];  Service: Orthopedics;  Laterality: Right;   TENDON REPAIR Right 07/24/2021   Procedure: TENDON REPAIR OF WRIST, TENDON SYNOVECTOMY AND TENDON TRANSFER AND RECONSTRUCTION,SECONDARY TO MULTIPLE RUPTURES,AND POSSIBLE DISTAL RADIAL ULNAR JOINT RESECTION WITH TENDON TRANSFER;  Surgeon: Camella Fallow, MD;  Location: [facility];  Service: Orthopedics;  Laterality: Right;  BLOCK WITH IV SEDATION   TENDON TRANSFER Right 07/24/2021   Procedure: TENDON TRANSFER;  Surgeon: Camella Fallow, MD;  Location: [facility];  Service: Orthopedics;  Laterality: Right;   TONSILLECTOMY     TOTAL HIP ARTHROPLASTY Left 02/26/2021   Procedure: TOTAL HIP ARTHROPLASTY ANTERIOR APPROACH;  Surgeon: Melodi Lerner, MD;  Location: [facility];  Service: Orthopedics;  Laterality: Left;    WRIST SURGERY       Social History:   reports that he has quit smoking. He has never used smokeless tobacco. He reports current alcohol  use. He reports that he does not use  drugs.   Family History:  His family history is not on file.   Allergies No Known Allergies   Home Medications  Prior to Admission medications   Medication Sig Start Date End Date Taking? Authorizing Provider  cholecalciferol  (VITAMIN D3) 25 MCG (1000 UNIT) tablet Take 1,000 Units by mouth daily.    [provider]  lisinopril  (ZESTRIL ) 20 MG tablet Take 20 mg by mouth daily. 01/11/21   [provider]  Multiple Vitamins-Minerals (CENTRUM SILVER PO) Take 1 tablet by mouth daily.    [provider]  sulfaSALAzine  (AZULFIDINE ) 500 MG tablet Take 1,000 mg by mouth 2 (two) times daily.    [provider]     CCM time 40 Tinnie FORBES Adolph DEVONNA [facility] 06/21/24 11:00 AM  Please see [email or web address] for pager details.  From 7A-7P if no response, please call [phone] After hours, please call ELink [phone]

## 2024-06-22 DIAGNOSIS — R569 Unspecified convulsions: Secondary | ICD-10-CM | POA: Diagnosis not present

## 2024-06-22 DIAGNOSIS — Z515 Encounter for palliative care: Secondary | ICD-10-CM | POA: Diagnosis not present

## 2024-06-22 DIAGNOSIS — R4589 Other symptoms and signs involving emotional state: Secondary | ICD-10-CM

## 2024-06-22 DIAGNOSIS — Z7189 Other specified counseling: Secondary | ICD-10-CM

## 2024-06-22 DIAGNOSIS — Z66 Do not resuscitate: Secondary | ICD-10-CM | POA: Diagnosis not present

## 2024-06-22 DIAGNOSIS — Z789 Other specified health status: Secondary | ICD-10-CM

## 2024-06-22 DIAGNOSIS — S065XAA Traumatic subdural hemorrhage with loss of consciousness status unknown, initial encounter: Secondary | ICD-10-CM | POA: Diagnosis not present

## 2024-06-22 MED ORDER — MIDAZOLAM HCL 2 MG/2ML IJ SOLN: 2.0000 mg | INTRAMUSCULAR | Status: DC | PRN

## 2024-06-22 MED ORDER — MORPHINE SULFATE (PF) 2 MG/ML IV SOLN
2.0000 mg | INTRAVENOUS | Status: DC | PRN
Start: 2024-06-22 — End: 2024-06-23

## 2024-06-22 MED ORDER — MORPHINE BOLUS VIA INFUSION: 2.0000 mg | INTRAVENOUS | Status: DC | PRN

## 2024-06-22 NOTE — Progress Notes (Signed)
 Notified by Victory Gens, Md to stop the morphine  infusion. He will also come and speak with the family after his surgical case.

## 2024-06-22 NOTE — Procedures (Signed)
 Patient Name: Kenneth Roy  MRN: [id number]  Epilepsy Attending: Arlin MALVA Krebs  Referring Physician/Provider: Michaela Aisha SQUIBB, MD  Duration: 06/21/2024 0913 to 06/21/2024 2333   Patient history:  89 year old with subdural hematoma being evaluated for seizures    Level of alertness: Awake, asleep   AEDs during EEG study: LEV, VPA   Technical aspects: This EEG study was done with scalp electrodes positioned according to the 10-20 International system of electrode placement. Electrical activity was reviewed with band pass filter of 1-70Hz , sensitivity of 7 uV/mm, display speed of 79mm/sec with a 60Hz  notched filter applied as appropriate. EEG data were recorded continuously and digitally stored.  Video monitoring was available and reviewed as appropriate.   Description: No clear posterior dominant rhythm was seen. Sleep was characterized by vertex waves, sleep spindles (12 to 14 Hz), maximal frontocentral region.  EEG showed continuous generalized and lateralized left hemisphere 3 to 6 Hz theta-delta slowing. Lateralized periodic discharges with overriding fast activity were noted in left hemisphere, maximal left posterior quadrant  every 3-5 seconds, at times rhythmic lasting 3-9 seconds consistent with brief ictal-interictal rhythmic discharges.   Seizure without definite clinical signs was noted arising from left posterior quadrant.  During seizure, EEG showed polyspikes in left hemisphere, maximal left posterior quadrant at 1-2hz  with overlying rhythmicity admixed with 6-8hz  theta-alpha activity which then evolved to 2-3hz  delta slowing. Average 2-5 seizures were noted per hour, lasting 30 seconds to 1 minute each.    Hyperventilation and photic stimulation were not performed.      ABNORMALITY - Seizure without clinical signs, left posterior quadrant.  - Lateralized periodic discharges with overriding fast activity ( LPD +F) left hemisphere, maximal left posterior quadrant - Brief  ictal-interictal rhythmic discharges, left hemisphere, maximal left posterior quadrant - Continuous slow, generalized and lateralized left hemisphere,    IMPRESSION: This study showed seizure without clinical signs arising from left posterior quadrant. Average 2-5 seizures were noted per hour, lasting 30 seconds to 1 minute each.   Additionally EEG showed evidence of epileptogenicity arising from left hemisphere, maximal left posterior quadrant. This EEG pattern is on the ictal-interictal continuum with hugh suspicion for ictal nature due to the morphology and overlying rhythmicity.    EEG also showed cortical dysfunction arising from left hemisphere likely secondary to underlying structural abnormality.   Lastly there was moderate to severe diffuse encephalopathy,    Damian Buckles O Gilmar Bua

## 2024-06-22 NOTE — Progress Notes (Signed)
  Daily Progress Note   Date: 06/22/2024   Patient Name: Kenneth Roy  DOB: Nov 20, 1926  MRN: [id number]  Age / Sex: 89 y.o., male  Attending Physician: Colon Shove, MD Primary Care Physician: Vernadine Charlie ORN, MD Admit Date: 06/18/2024 Length of Stay: 4 days  Reason for Follow-up: {Reason for Consult:23484}  Past Medical History:  Diagnosis Date   GERD (gastroesophageal reflux disease)    Hypertension    Osteoarthritis    Rheumatoid arthritis (HCC)     Subjective:   Subjective: Chart Reviewed. Updates received. Patient Assessed. Created space and opportunity for patient  and family to explore thoughts and feelings regarding current medical situation.  Today's Discussion: Today before meeting with the patient/family, I reviewed the chart notes including ***. I also reviewed vital signs, nursing flowsheets, medication administrations record, labs, and imaging. Labs reviewed include ***.  ***  Review of Systems  Objective:   Primary Diagnoses: Present on Admission:  Subdural hematoma (HCC)  Chronic subdural hematoma (HCC)  Subdural hematoma, post-traumatic (HCC)   Vital Signs:  BP (!) 75/39 (BP Location: Right Arm)   Pulse 89   Temp 98.2 F (36.8 C)   Resp 14   Wt 67.4 kg   SpO2 95%   BMI 23.98 kg/m   Physical Exam  Palliative Assessment/Data: ***   Existing Vynca/ACP Documentation: ***  Advanced Care Planning:   Existing Vynca/ACP Documentation: ***  Primary Decision Maker: {Primary Decision Fjxzm:78612}  Pertinent diagnosis: ***  The patient and/or family consented to a voluntary Advance Care Planning Conversation in person/over the phone***. Individuals present for the conversation: ***  Summary of the conversation: ***  Outcome of the conversations and/or documents completed: ***  I spent *** minutes providing separately identifiable ACP services with the patient and/or surrogate decision maker in a voluntary, in-person conversation discussing  the patient's wishes and goals as detailed in the above note.  Assessment & Plan:   HPI/Patient Profile:  ***  SUMMARY OF RECOMMENDATIONS   ***  Symptom Management:  ***  Code Status: {Updated Palliative Code Status:33307}  Prognosis: {Palliative Care Prognosis:23504}  Discharge Planning: {Palliative dispostion:23505}  Discussed with: ***  Thank you for allowing us  to participate in the care of RASHEEN BELLS PMT will continue to support holistically.  Time Total: ***  Detailed review of medical records (labs, imaging, vital signs), medically appropriate exam, discussed with treatment team, counseling and education to patient, family, & staff, documenting clinical information, medication management, coordination of care  Camellia Kays, NP Palliative Medicine Team  Team Phone # [phone] (Nights/Weekends)  06/12/2021, 8:17 AM

## 2024-06-22 NOTE — Progress Notes (Signed)
 Patient ID: Kenneth Roy, male   DOB: [birth year], 89 y.o.   MRN: [id number] Stop by to see patient today and family is wondering why he is on morphine .  Patient is not showing any external signs of trauma.  Appears to be resting comfortably.  Was then informed that the patient is on my service.  He is to be transferred to [facility] however the family being unsettled about the nature of his medications I advised stopping the morphine  and observing today.  I will write for as needed morphine  and Ativan  Ativan  being for seizures and the morphine  for any external signs of pain.  At this time he does not respond to deep central pain he is breathing at approximately 14/min unlabored.  Pupils are 2 mm and sluggishly reactive.  I noted yesterday on his CT scan that his postoperative decompression appears good he has no shift of the brain however there is still about a 4 mm wood collection.  This is much improved from his preoperative situation where he had about 7 mm of shift.  Nonetheless his neurologic status remains very poor.  Agree with ongoing DNR.

## 2024-06-22 NOTE — Progress Notes (Addendum)
 Brief Neuro Note:  Spoke with family at their request. They had discussion with neurosurgery team and and were somehow under the impression that since there were no clinical jerking/tremoring that patient was not seizing.  I clarified with the family that most of the seizures captured on LTM EEG were subclinical seizures without any clear clinical signs.  Report for partial day EEG yesterday again with average of 2-5 seizures per hour without clinical signs arising from left posterior quadrant.  Family also reported that the palliative team was by earlier and spoke with them too.  Morphine  was stopped and family had some questions about this too.  I discussed that Morphine  has no effect on seizures.  It is more for patient comfort in this case.  If patient has clinical seizures, the recommended treatment from a neurology standpoint on a patient who is comfort care is to administer intramuscular Ativan .  I also discussed with family that subclinical seizures usually do not cause pain and it is typical for patients do not have any awareness during seizures.  Family did not have any additional questions for me.  I had to step outside to attend to a emergency.  Neurology will be available for any questions or concerns arise.  Alyscia Carmon [facility]

## 2024-06-22 NOTE — Care Management Important Message (Signed)
 Important Message  Patient Details  Name: Kenneth Roy MRN: [id number] Date of Birth: May 29, 1927   Important Message Given:  Yes - Medicare IM     Jon Cruel 06/22/2024, 4:25 PM

## 2024-06-22 NOTE — Progress Notes (Signed)
 Pt transitioned to comfort care. We will signoff.  Eira Alpert [facility]

## 2024-06-22 NOTE — TOC Initial Note (Signed)
 Transition of Care (TOC) - Initial/Assessment Note   Spoke to patient's daughters at bedside Lonell Geralds, and Inocente .   Confirmed they are interested in residual hospice. Their first choice is [facility], second [facility].   Lonell is contact person confirmed phone number is [phone].   Referral given to Amy with [facility] . Await determination.   DNR form on chart .   Patient Details  Name: Kenneth Roy MRN: [id number] Date of Birth: 08/24/1927  Transition [facility]) CM/SW Contact:    Stephane Powell Jansky, RN Phone Number: 06/22/2024, 9:56 AM  Clinical Narrative:                   Expected Discharge Plan: Hospice Medical Facility     Patient Goals and CMS Choice   CMS Medicare.gov Compare Post Acute Care list provided to:: Patient Represenative (must comment) (daughters at bedside) Choice offered to / list presented to : Adult Children      Expected Discharge Plan and Services   Discharge Planning Services: CM Consult Post Acute Care Choice: Hospice Living arrangements for the past 2 months: Single Family Home                 DME Arranged: N/A DME Agency: NA       HH Arranged: NA HH Agency: NA        Prior Living Arrangements/Services Living arrangements for the past 2 months: Single Family Home Lives with:: Self Patient language and need for interpreter reviewed:: Yes              Criminal Activity/Legal Involvement Pertinent to Current Situation/Hospitalization: No - Comment as needed  Activities of Daily Living   ADL Screening (condition at time of admission) Independently performs ADLs?: No Does the patient have a NEW difficulty with bathing/dressing/toileting/self-feeding that is expected to last >3 days?: Yes (Initiates electronic notice to provider for possible OT consult) Does the patient have a NEW difficulty with getting in/out of bed, walking, or climbing stairs that is expected to last >3 days?: Yes (Initiates  electronic notice to provider for possible PT consult) Does the patient have a NEW difficulty with communication that is expected to last >3 days?: Yes (Initiates electronic notice to provider for possible SLP consult) Is the patient deaf or have difficulty hearing?: Yes Does the patient have difficulty seeing, even when wearing glasses/contacts?: No Does the patient have difficulty concentrating, remembering, or making decisions?: Yes  Permission Sought/Granted                  Emotional Assessment Appearance:: Appears stated age Attitude/Demeanor/Rapport: Unable to Assess Affect (typically observed): Unable to Assess   Alcohol  / Substance Use: Not Applicable Psych Involvement: No (comment)  Admission diagnosis:  Subdural hematoma (HCC) [S06.5XAA] SDH (subdural hematoma) (HCC) [S06.5XAA] Chronic subdural hematoma (HCC) [I62.03] Subdural hematoma, post-traumatic (HCC) [S06.5XAA] Patient Active Problem List   Diagnosis Date Noted   Subdural hematoma, post-traumatic (HCC) 06/19/2024   Pressure injury of skin 06/19/2024   Subdural hematoma (HCC) 06/18/2024   Chronic subdural hematoma (HCC) 06/18/2024   Rheumatoid arthritis (HCC) 07/24/2021   OA (osteoarthritis) of hip 02/26/2021   Primary osteoarthritis of left hip 02/26/2021   PCP:  Vernadine Charlie ORN, MD Pharmacy:   [id number] [address], [place] - [address] AT [facility] [address] [facility] [address] [place] [address] Phone: [phone] Fax: [phone]  Social Drivers of Health (SDOH) Social History: SDOH Screenings   Food Insecurity: Patient Unable To Answer (06/20/2024)  Housing: Unknown (06/20/2024)  Tobacco Use: Medium Risk (06/18/2024)   SDOH Interventions:     Readmission Risk Interventions     No data to display

## 2024-06-22 NOTE — Progress Notes (Signed)
 [facility] [phone] [facility]  Hospice hospital liaison note   Received request from [facility] for family interest in hospice inpatient unit. Talked with family at the bedside to discuss services and hospice philosophy of care. Patient is approved for [facility] inpatient hospice care by Dr.Juan Eric Showman, hospice provider.   Consents have been signed but the family states they have some concerns and would like to wait to transfer him tomorrow.   Hospital liaison will follow up tomorrow to see if they are ready for him to go to [facility].   Please do not hesitate to call with questions.   Amy Darien BSN, RN [employer] [phone]

## 2024-06-22 NOTE — Progress Notes (Addendum)
 LTM VIDEO EEG discontinued - no skin breakdown seen.  unhook by nurse. [facility] notified, cart and Head Box retrieved

## 2024-06-23 MED ORDER — ORAL CARE MOUTH RINSE: 15.0000 mL | OROMUCOSAL | 0 refills | Status: AC | PRN

## 2024-06-23 MED ORDER — POLYVINYL ALCOHOL 1.4 % OP SOLN: 1.0000 [drp] | Freq: Four times a day (QID) | OPHTHALMIC | 0 refills | Status: AC | PRN

## 2024-06-23 NOTE — Care Plan (Signed)
 82 mg Morphine  IV premix has been wasted with Odis, Consulting civil engineer.

## 2024-06-23 NOTE — Progress Notes (Signed)
 gned      [facility] 6N08 [facility]  Hospice hospital liaison note   Received request from [facility] for family interest in hospice inpatient unit. Talked with family at the bedside to discuss services and hospice philosophy of care. Patient is approved for [facility] inpatient hospice care by Dr.Juan Eric Showman, hospice provider.    Consents have been signed and transport has been arranged with [facility].  RN please call report to [facility] at [phone].   Please do not hesitate to call with questions.    Amy Darien BSN, RN [employer] [phone]

## 2024-06-23 NOTE — Care Plan (Signed)
 Transport arrived. Discharge packet handed. 2 IV left in place since pt will be going to [facility]. Changed foam dressing.

## 2024-06-23 NOTE — Progress Notes (Signed)
 Patient ID: Kenneth Roy, male   DOB: 02/09/1927, 89 y.o.   MRN: [id number] Patient has been off of morphine  drip for over 14 hours at this point.  He does not rouse to deep pain stimulation.  Some oral movements noted on suctioning.  Pupils are 2 mm and reactive.  No spontaneous eye opening even to painful stimulus.  I discussed the situation with the patient's daughters who are present and I recommend transfer to [facility] at this time.  Morphine  or Ativan  can be given as needed on a as needed basis.  They are in agreement.

## 2024-06-23 NOTE — Plan of Care (Signed)
  Problem: Education: Goal: Knowledge of General Education information will improve Description: Including pain rating scale, medication(s)/side effects and non-pharmacologic comfort measures Outcome: Not Progressing   Problem: Health Behavior/Discharge Planning: Goal: Ability to manage health-related needs will improve Outcome: Not Progressing   Problem: Clinical Measurements: Goal: Ability to maintain clinical measurements within normal limits will improve Outcome: Not Progressing   Problem: Clinical Measurements: Goal: Will remain free from infection Outcome: Not Progressing   Problem: Clinical Measurements: Goal: Diagnostic test results will improve Outcome: Not Progressing   Problem: Clinical Measurements: Goal: Respiratory complications will improve Outcome: Not Progressing   Problem: Nutrition: Goal: Adequate nutrition will be maintained 06/23/2024 0946 by Mallory Breed, Celia Vernard HERO, RN Outcome: Not Progressing 06/23/2024 0946 by Mallory Breed, Celia Vernard HERO, RN Outcome: Progressing

## 2024-06-23 NOTE — Discharge Summary (Signed)
 Physician Discharge Summary  Patient ID: Kenneth Roy MRN: [id number] DOB/AGE: 1927/03/08 89 y.o.  Admit date: 06/18/2024 Discharge date: 06/23/2024  Admission Diagnoses: Chronic subdural hematoma with speech arrest decreased level of consciousness  Discharge Diagnoses: Chronic subdural hematoma with speech arrest and decreased level of consciousness Principal Problem:   Subdural hematoma (HCC) Active Problems:   Chronic subdural hematoma (HCC)   Subdural hematoma, post-traumatic (HCC)   Pressure injury of skin   Discharged Condition: Terminal  Hospital Course: Patient was admitted to the emergency room and found to have a large chronic subdural hematoma.  He underwent bur hole drainage emergently on the day of admission however postoperatively the patient's neurologic status did not improve significantly he developed seizures on the first postoperative day and this was treated medically ultimately adding 3 total medications which did not control persistent focal seizures.  Because of his poor performance status even though his CT scan appeared much better with decompression of the subdural it was determined by the family that they did not seek aggressive care for intubation and NG feedings and comfort care was chosen.  His medications were withdrawn.  The patient has remained essentially comatose breathing comfortably with little to no febrile episode.  [facility] at [facility]  Consults: pulmonary/intensive care  Significant Diagnostic Studies: None  Treatments: surgery: See op note  Discharge Exam: Blood pressure (!) 91/41, pulse (!) 101, temperature 98.3 F (36.8 C), temperature source Oral, resp. rate 12, weight 67.4 kg, SpO2 94%. Patient is deeply comatose even to deep central pain.  Pupils are 2 mm and sluggishly reactive.  There is a flicker of movement about the face when suctioning in the oral cavity.  He is breathing comfortably at a rate of  14.  Disposition: Discharge disposition: 51-Hospice/Medical Facility       Discharge Instructions     Discharge wound care:   Complete by: As directed    May remove staples from scalp in 5 days if necessary   Increase activity slowly   Complete by: As directed    Comfort care      Allergies as of 06/23/2024   No Known Allergies      Medication List     STOP taking these medications    CENTRUM SILVER PO   cholecalciferol  25 MCG (1000 UNIT) tablet Commonly known as: VITAMIN D3   lisinopril  20 MG tablet Commonly known as: ZESTRIL    sulfaSALAzine  500 MG tablet Commonly known as: AZULFIDINE        TAKE these medications    artificial tears ophthalmic solution Place 1 drop into both eyes 4 (four) times daily as needed for dry eyes.   mouth rinse Liqd solution 15 mLs by Mouth Rinse route as needed (for oral care).               Discharge Care Instructions  (From admission, onward)           Start     Ordered   06/23/24 0000  Discharge wound care:       Comments: May remove staples from scalp in 5 days if necessary   06/23/24 9061             Signed: Victory JINNY Roy 06/23/2024, 9:39 AM

## 2024-06-23 NOTE — Care Plan (Signed)
 Report given to Sharlet Endo, RN to [facility] [phone] with some questions asked and answered.

## 2024-07-02 ENCOUNTER — Ambulatory Visit: Admitting: Podiatry

## 2024-07-14 DEATH — deceased
